# Patient Record
Sex: Female | Born: 1980
Health system: Southern US, Community
[De-identification: ages and names within clinical notes are randomized; demographics above are authoritative.]

## PROBLEM LIST (undated history)

## (undated) DIAGNOSIS — G971 Other reaction to spinal and lumbar puncture: Secondary | ICD-10-CM

## (undated) DIAGNOSIS — I10 Essential (primary) hypertension: Secondary | ICD-10-CM

## (undated) DIAGNOSIS — E785 Hyperlipidemia, unspecified: Secondary | ICD-10-CM

## (undated) HISTORY — PX: NO PAST SURGERIES: SHX2092

## (undated) HISTORY — DX: Hyperlipidemia, unspecified: E78.5

## (undated) HISTORY — DX: Other reaction to spinal and lumbar puncture: G97.1

## (undated) HISTORY — DX: Essential (primary) hypertension: I10

---

## 2003-05-09 ENCOUNTER — Ambulatory Visit (HOSPITAL_COMMUNITY): Admission: RE | Admit: 2003-05-09 | Discharge: 2003-05-09 | Payer: Self-pay | Admitting: Obstetrics & Gynecology

## 2003-07-19 ENCOUNTER — Encounter: Admission: RE | Admit: 2003-07-19 | Discharge: 2003-07-19 | Payer: Self-pay | Admitting: Internal Medicine

## 2003-11-04 ENCOUNTER — Encounter: Admission: RE | Admit: 2003-11-04 | Discharge: 2003-11-04 | Payer: Self-pay | Admitting: Chiropractic Medicine

## 2006-01-14 ENCOUNTER — Ambulatory Visit (HOSPITAL_COMMUNITY): Admission: RE | Admit: 2006-01-14 | Discharge: 2006-01-14 | Payer: Self-pay | Admitting: Obstetrics & Gynecology

## 2006-06-15 ENCOUNTER — Inpatient Hospital Stay (HOSPITAL_COMMUNITY): Admission: AD | Admit: 2006-06-15 | Discharge: 2006-07-07 | Payer: Self-pay | Admitting: Obstetrics

## 2006-06-17 ENCOUNTER — Encounter: Payer: Self-pay | Admitting: Obstetrics & Gynecology

## 2006-07-03 ENCOUNTER — Encounter: Payer: Self-pay | Admitting: Obstetrics

## 2006-07-05 ENCOUNTER — Encounter: Payer: Self-pay | Admitting: Obstetrics

## 2006-10-19 ENCOUNTER — Ambulatory Visit (HOSPITAL_COMMUNITY): Admission: RE | Admit: 2006-10-19 | Discharge: 2006-10-19 | Payer: Self-pay | Admitting: Obstetrics

## 2008-06-03 IMAGING — US US OB FOLLOW-UP
1 series · 14 of 28 positions shown · non-contrast
Comparison: none

OBSTETRICAL ULTRASOUND:
 This ultrasound was performed in The [HOSPITAL], and the [facility] report will be stored to [REDACTED] PACS.

[Series 1: us ob follow-up · 14 of 36 slices shown]
[im 2/36]
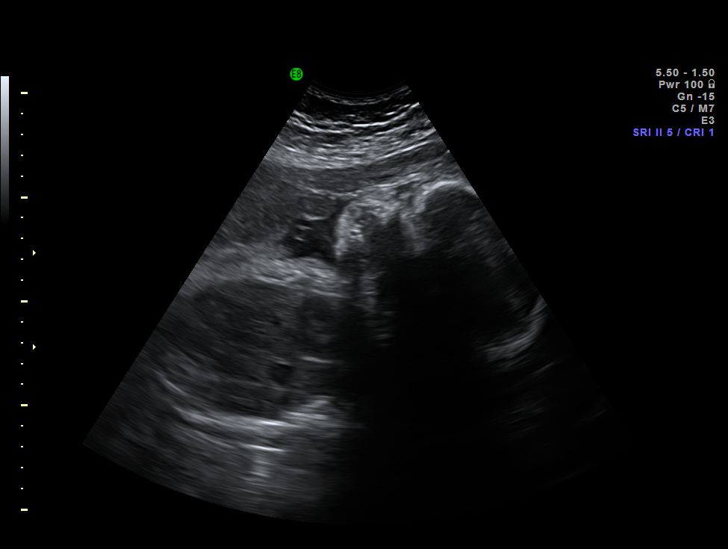
[im 4/36]
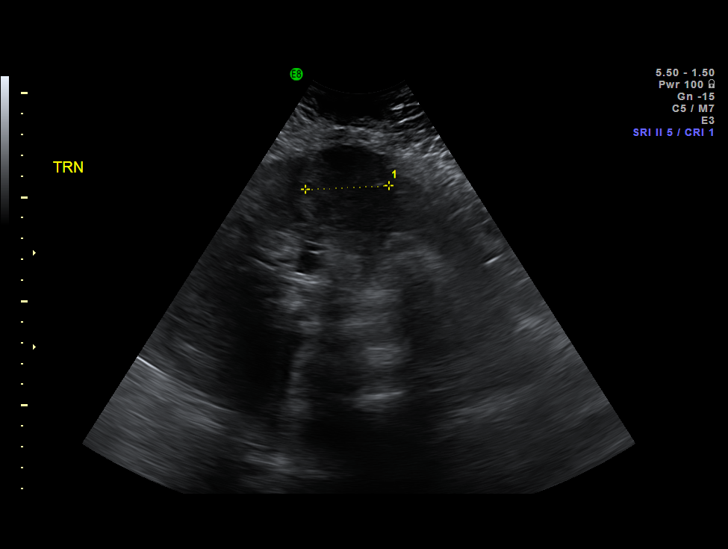
[im 7/36]
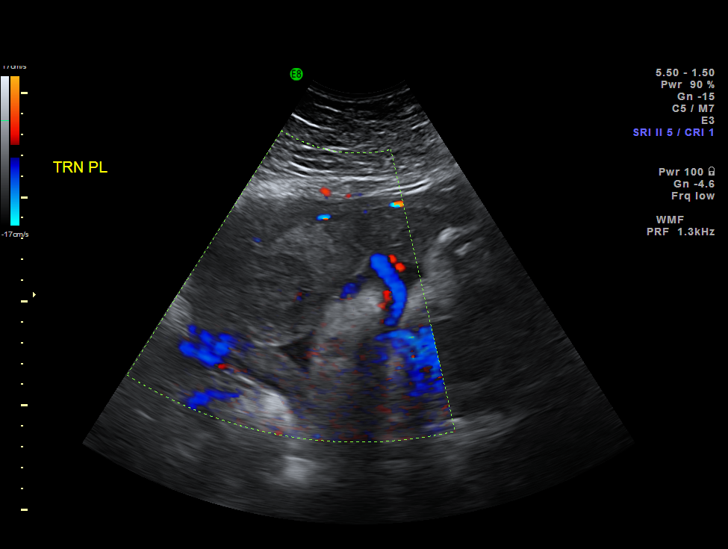
[im 10/36]
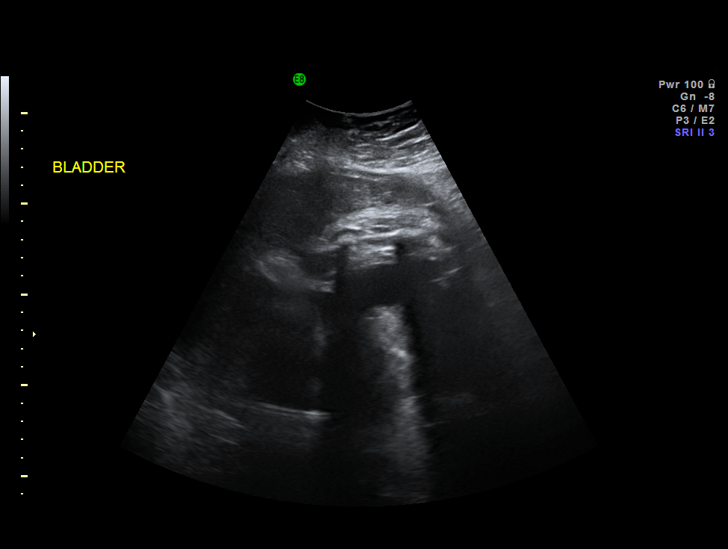
[im 12/36]
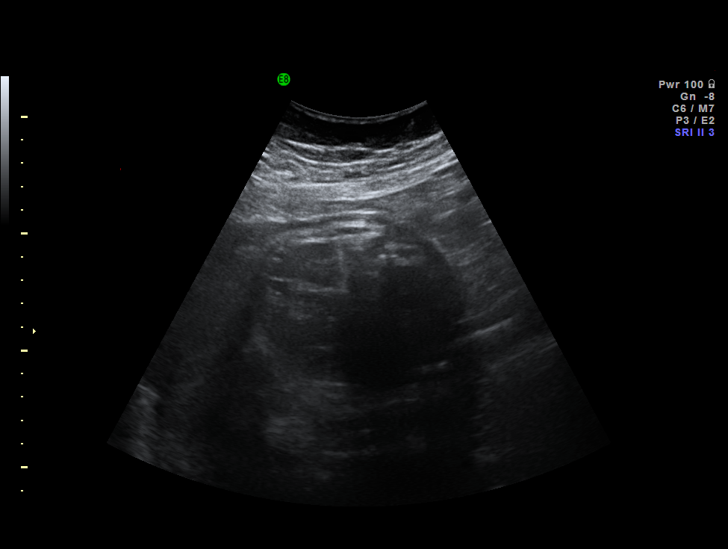
[im 15/36]
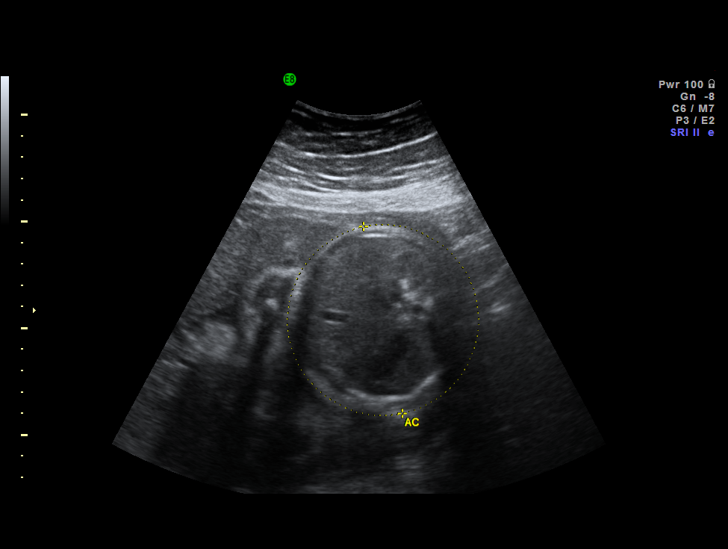
[im 17/36]
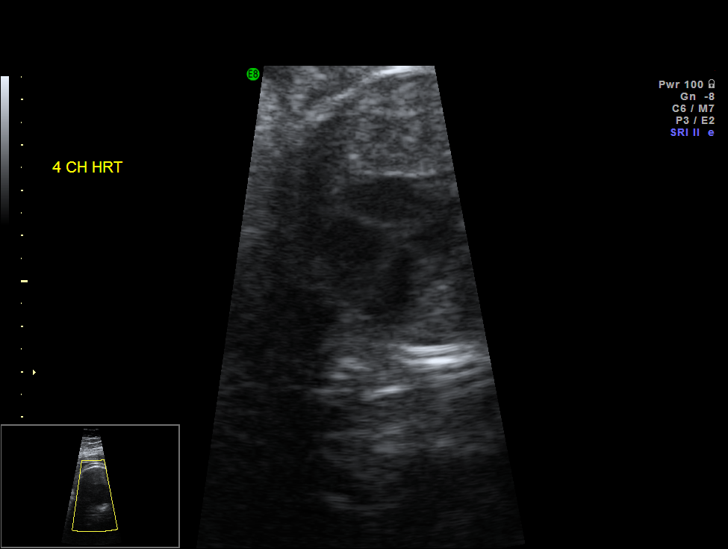
[im 20/36]
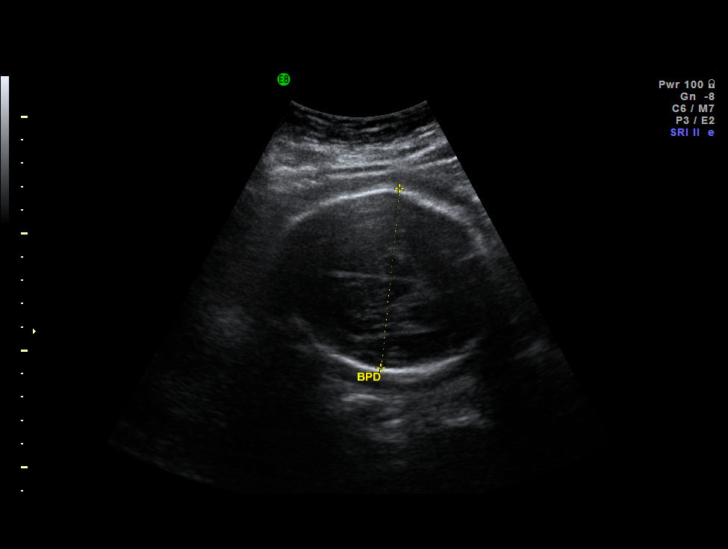
[im 23/36]
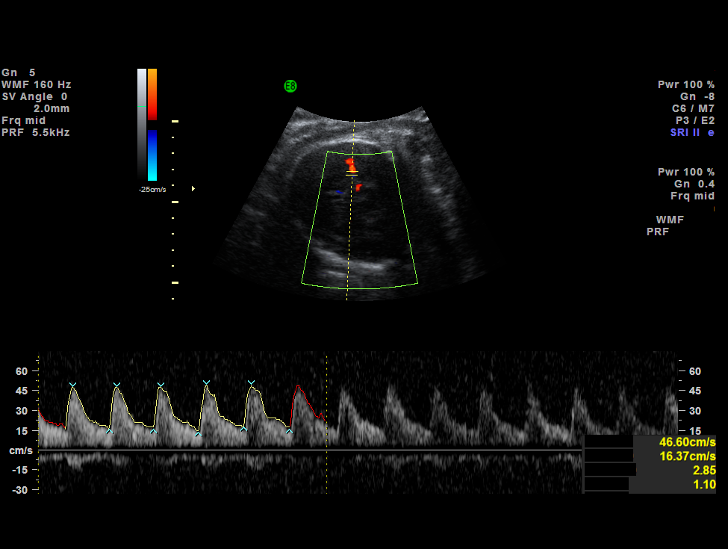
[im 25/36]
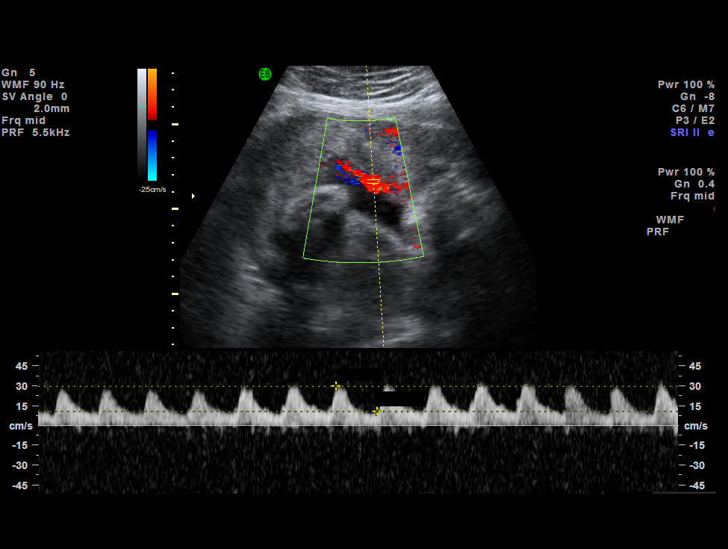
[im 28/36]
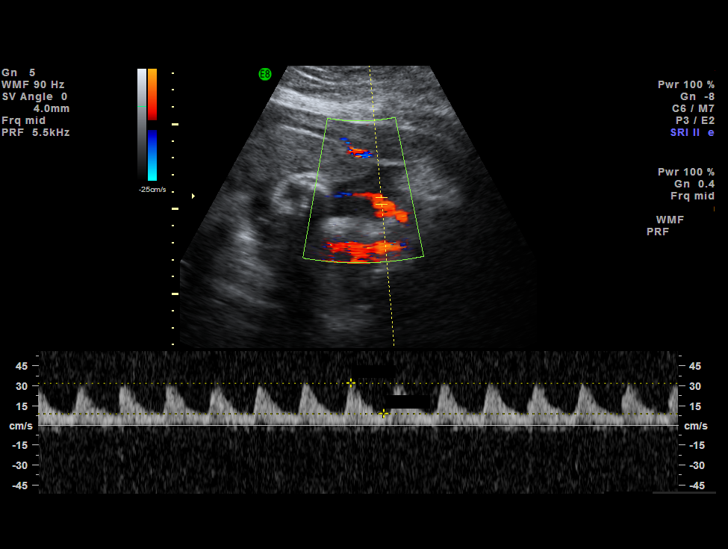
[im 30/36]
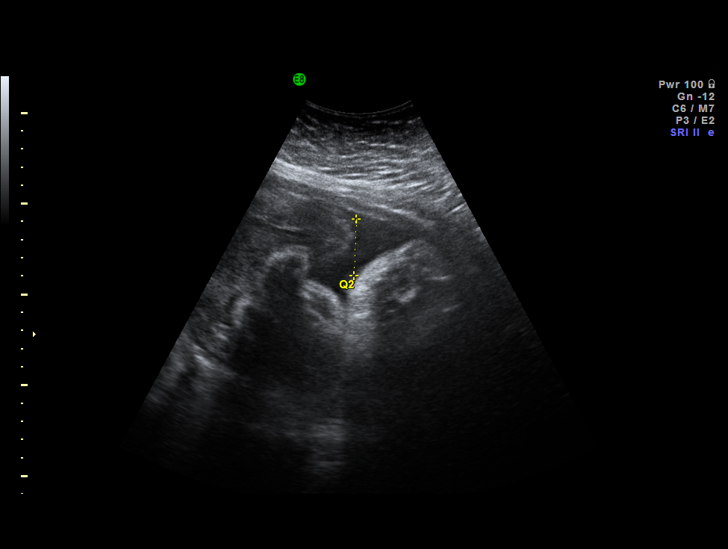
[im 33/36]
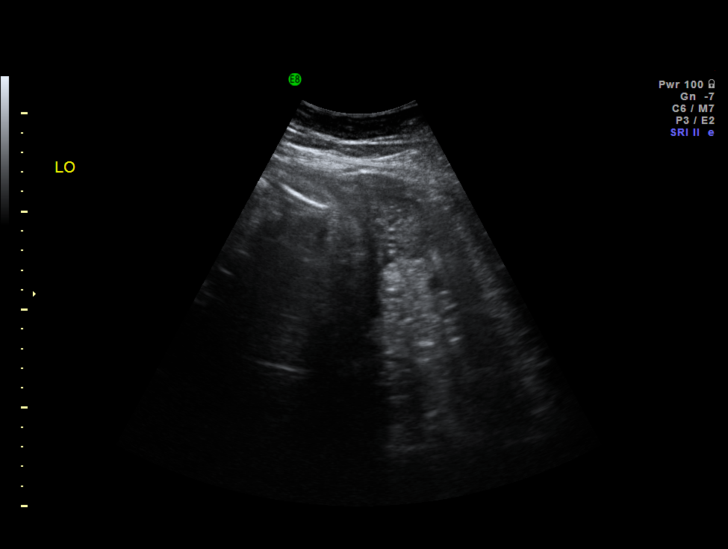
[im 36/36]
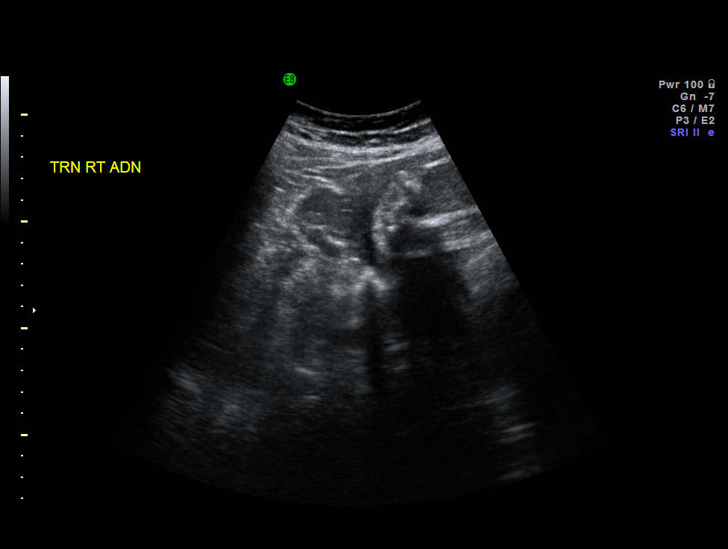

[14 of 28 positions shown; findings below may reference images not displayed]

IMPRESSION: The [facility] report has also been faxed to the ordering physician.

## 2008-10-13 ENCOUNTER — Ambulatory Visit (HOSPITAL_COMMUNITY): Admission: RE | Admit: 2008-10-13 | Discharge: 2008-10-13 | Payer: Self-pay | Admitting: Obstetrics & Gynecology

## 2008-11-27 ENCOUNTER — Ambulatory Visit (HOSPITAL_COMMUNITY): Admission: RE | Admit: 2008-11-27 | Discharge: 2008-11-27 | Payer: Self-pay | Admitting: Obstetrics & Gynecology

## 2009-01-08 ENCOUNTER — Ambulatory Visit (HOSPITAL_COMMUNITY): Admission: RE | Admit: 2009-01-08 | Discharge: 2009-01-08 | Payer: Self-pay | Admitting: Obstetrics & Gynecology

## 2009-01-29 ENCOUNTER — Ambulatory Visit (HOSPITAL_COMMUNITY): Admission: RE | Admit: 2009-01-29 | Discharge: 2009-01-29 | Payer: Self-pay | Admitting: Obstetrics & Gynecology

## 2009-02-26 ENCOUNTER — Ambulatory Visit (HOSPITAL_COMMUNITY): Admission: RE | Admit: 2009-02-26 | Discharge: 2009-02-26 | Payer: Self-pay | Admitting: Obstetrics & Gynecology

## 2009-03-26 ENCOUNTER — Ambulatory Visit (HOSPITAL_COMMUNITY): Admission: RE | Admit: 2009-03-26 | Discharge: 2009-03-26 | Payer: Self-pay | Admitting: Obstetrics & Gynecology

## 2009-04-02 ENCOUNTER — Ambulatory Visit (HOSPITAL_COMMUNITY): Admission: RE | Admit: 2009-04-02 | Discharge: 2009-04-02 | Payer: Self-pay | Admitting: Obstetrics & Gynecology

## 2009-04-20 ENCOUNTER — Inpatient Hospital Stay (HOSPITAL_COMMUNITY): Admission: RE | Admit: 2009-04-20 | Discharge: 2009-04-23 | Payer: Self-pay | Admitting: Obstetrics & Gynecology

## 2009-04-20 ENCOUNTER — Encounter: Payer: Self-pay | Admitting: Obstetrics & Gynecology

## 2009-06-21 ENCOUNTER — Encounter: Payer: Self-pay | Admitting: Internal Medicine

## 2010-02-17 ENCOUNTER — Encounter: Payer: Self-pay | Admitting: Obstetrics

## 2010-02-17 ENCOUNTER — Encounter: Payer: Self-pay | Admitting: Obstetrics & Gynecology

## 2010-02-26 NOTE — Consult Note (Signed)
Summary: [facility]  [facility]   Imported By: Marylou Mccoy 07/18/2009 15:45:36  _____________________________________________________________________  External Attachment:    Type:   Image     Comment:   External Document

## 2010-04-21 LAB — CBC
HCT: 34.2 % — ABNORMAL LOW (ref 36.0–46.0)
HCT: 36.8 % (ref 36.0–46.0)
Hemoglobin: 11.2 g/dL — ABNORMAL LOW (ref 12.0–15.0)
Hemoglobin: 12.2 g/dL (ref 12.0–15.0)
MCHC: 33 g/dL (ref 30.0–36.0)
MCV: 81.9 fL (ref 78.0–100.0)
MCV: 82.9 fL (ref 78.0–100.0)
Platelets: 193 10*3/uL (ref 150–400)
RBC: 4.1 MIL/uL (ref 3.87–5.11)
RBC: 4.43 MIL/uL (ref 3.87–5.11)
RBC: 4.49 MIL/uL (ref 3.87–5.11)
RDW: 15.1 % (ref 11.5–15.5)
RDW: 15.3 % (ref 11.5–15.5)
WBC: 10.4 10*3/uL (ref 4.0–10.5)
WBC: 6.4 10*3/uL (ref 4.0–10.5)
WBC: 9 10*3/uL (ref 4.0–10.5)

## 2010-04-21 LAB — COMPREHENSIVE METABOLIC PANEL
AST: 15 U/L (ref 0–37)
CO2: 20 mEq/L (ref 19–32)
Calcium: 8.9 mg/dL (ref 8.4–10.5)
Creatinine, Ser: 0.45 mg/dL (ref 0.4–1.2)
GFR calc Af Amer: >60 mL/min (ref >60)
GFR calc non Af Amer: >60 mL/min (ref >60)

## 2010-04-21 LAB — RPR: RPR Ser Ql: NONREACTIVE

## 2010-04-21 LAB — MRSA PCR SCREENING: MRSA by PCR: NEGATIVE

## 2010-06-11 NOTE — H&P (Signed)
NAMEJENNE, Caroline Jones             ACCOUNT NO.:  [id number]   MEDICAL RECORD NO.:  [id number]          PATIENT TYPE:  INP   LOCATION:  9153                          FACILITY:  [facility]   PHYSICIAN:  Roseanna Rainbow, M.D.DATE OF BIRTH:  23-Apr-1980   DATE OF ADMISSION:  06/15/2006  DATE OF DISCHARGE:                              HISTORY & PHYSICAL   CHIEF COMPLAINT:  The patient is a 30 year old, gravida 1, para 0, with  and estimated date of confinement of July 21, now at 31 weeks with  elevated blood pressure.   HISTORY OF PRESENT ILLNESS:  The patient has had somewhat labile blood  pressures dating back to the early second trimester of pregnancy.  At 14  weeks, the blood pressure was 140/94.  She denies any neurological  symptoms.  She does report non-dependent swelling in her hands and face.   ALLERGIES:  No known drug allergies.   MEDICATIONS:  Prenatal vitamins.   PRENATAL SCREENINGS:  Chlamydia probe negative.  Urine culture and  sensitivity: Insignificant growth.  GC probe negative.  One-hour GTT  113.  Hepatitis B surface antigen negative.  Hematocrit 38.7, hemoglobin  11.5.  HIV nonreactive.  Pap smear negative.  Platelets 426,000.  Blood  type O positive.  Antibody screen negative.  RPR nonreactive.  Rubella  immune.  Sickle cell negative.  Varicella immune.  PIH panel on May 15  remarkable for uric acid of 6.4.  A 24-hour urine collection is pending.   PAST GYN HISTORY:  Noncontributory.   PAST MEDICAL HISTORY:  No significant history of medical diseases.   PAST SURGICAL HISTORY:  No previous surgery.   SOCIAL HISTORY:  She is married, living with her spouse.  Denies any  tobacco, ethanol, or drug use.   PHYSICAL EXAMINATION:  VITAL SIGNS:  Blood pressure 156/100.  Nonstress  test reassuring.  Urine dip: Trace protein.  GENERAL:  Well-developed, well-nourished, no apparent distress.  ABDOMEN: Gravid.  PELVIC:  Exam deferred.  EXTREMITIES:  Lower extremities  1-2+ pretibial edema.   ASSESSMENT:  Primigravida with intrauterine pregnancy at 31 weeks with  questionable chronic blood pressure issues, rule out superimposed  pregnancy-induced hypertension.   PLAN:  1. Admission.  2. Bed rest.  3. Serial lab work.  4. Review the 24-hour urine collection results.  5. Complete OB ultrasound.  6. Betamethasone to stimulate fetal lung maturity.      Roseanna Rainbow, M.D.  Electronically Signed     LAJ/MEDQ  D:  06/15/2006  T:  06/15/2006  Job:  [id number]

## 2010-06-14 NOTE — Discharge Summary (Signed)
Caroline Jones, Caroline Jones             ACCOUNT NO.:  [id number]   MEDICAL RECORD NO.:  [id number]          PATIENT TYPE:  INP   LOCATION:  9320                          FACILITY:  [facility]   PHYSICIAN:  Charles A. Clearance Coots, M.D.DATE OF BIRTH:  11-11-1980   DATE OF ADMISSION:  06/15/2006  DATE OF DISCHARGE:  07/07/2006                               DISCHARGE SUMMARY   ADMISSION DIAGNOSES:  1. [redacted] weeks gestation.  2. Increased blood pressure.   DISCHARGE DIAGNOSIS:  1. [redacted] weeks gestation.  2. Increased blood pressure.  3. Status post induction of labor and normal spontaneous vaginal      delivery, viable female on July 05, 2006 at 0939.  Apgars 8 at 1      minute 8 at 5 minutes, weight of 1486 grams, length of 44 cm.      Infant was taken to the neonatal intensive care unit, mother      discharged home in good condition.   REASON FOR ADMISSION:  30 year old G1, estimated date of confinement  August 17, 2006, presents at [redacted] weeks gestation with elevated blood  pressure.  The patient has had a somewhat labile blood pressure dating  back to the early second trimester pregnancy. At 14 weeks the blood  pressure was 140/94.  She denied any neurological symptoms.  She does  report non dependent swelling in her hands and face.   PAST MEDICAL HISTORY:  Surgery none.  Illnesses none.   MEDICATIONS:  Prenatal vitamins.   ALLERGIES:  NO KNOWN DRUG ALLERGIES.   SOCIAL HISTORY:  Married, living with spouse.  Denies any tobacco,  alcohol or recreational drug use.   PHYSICAL EXAM:  VITAL SIGNS:  Blood pressure 156/100, she is afebrile.  External fetal monitoring was reassuring.  LUNGS:  Clear to auscultation bilaterally.  HEART: Regular rate and rhythm.  ABDOMEN:  Gravid, nontender.  PELVIC:  Cervical exam was deferred.  EXTREMITIES:  Revealed 1-2+ pretibial edema of the lower extremities.   IMPRESSION:  [redacted] weeks gestation, increased blood pressure rule out  pregnancy-induced hypertension.   PLAN:   Admit, placed on bedrest.  Serial labs, start a 24-hour urine  collection for creatinine clearance and total protein.   ADMITTING LABORATORY:  Hemoglobin 11.8, hematocrit 35.9, white blood  cell count 10,000, platelets 232,000.  Comprehensive metabolic panel was  within normal limits.  Urinalysis revealed specific gravity 1.030,  negative protein.   HOSPITAL COURSE:  The patient was admitted and placed on bedrest, blood  pressures continued to be elevated. The patient was placed on magnesium  sulfate IV along with p.o. labetalol. She continued to have increased  blood pressures despite the magnesium sulfate and p.o. labetalol. At  approximately [redacted] weeks gestation a decision was made to proceed with  induction of labor for preeclampsia. The patient was transferred to  labor and delivery and underwent two-stage induction of labor with  cervical ripening followed by Pitocin. She progressed quite well in  labor to normal spontaneous vaginal delivery viable female infant on  July 05, 2006. Postpartum course was complicated by increased blood  pressures and the patient was  continued on IV magnesium sulfate and p.o.  antihypertensives for elevated blood pressures. Pressures were stable at  the systolic to 140-160 over diastolics of 80-100 on postpartum day #2.  The patient was discharged home on postpartum day #2 on p.o.  antihypertensive therapy to be followed up with blood pressure checks at  home and in the office.   DISCHARGE LABORATORY VALUES:  Hemoglobin 11.7, hematocrit 35.4, white  blood cell count 8600, platelets 152,000. Comprehensive metabolic panel  was within normal limits.   DISCHARGE DISPOSITION:   DISCHARGE MEDICATIONS:  Continue labetalol and Clonidine as directed;  Clonidine 0.2 mg p.o. b.i.d. and labetalol 200 mg p.o. twice a day.   The patient is to have a home health nurse come for a blood pressure  check daily and she will follow up in the office in 1 week.       Charles A. Clearance Coots, M.D.  Electronically Signed     CAH/MEDQ  D:  08/06/2006  T:  08/06/2006  Job:  [id number]

## 2010-10-21 ENCOUNTER — Other Ambulatory Visit: Payer: Self-pay | Admitting: Internal Medicine

## 2010-10-21 ENCOUNTER — Ambulatory Visit (INDEPENDENT_AMBULATORY_CARE_PROVIDER_SITE_OTHER): Payer: 59 | Admitting: Internal Medicine

## 2010-10-21 ENCOUNTER — Other Ambulatory Visit (INDEPENDENT_AMBULATORY_CARE_PROVIDER_SITE_OTHER): Payer: 59

## 2010-10-21 ENCOUNTER — Encounter: Payer: Self-pay | Admitting: Internal Medicine

## 2010-10-21 VITALS — BP 142/88 | HR 79 | Temp 98.8°F | Resp 16 | Wt 224.4 lb

## 2010-10-21 DIAGNOSIS — I10 Essential (primary) hypertension: Secondary | ICD-10-CM

## 2010-10-21 DIAGNOSIS — Z136 Encounter for screening for cardiovascular disorders: Secondary | ICD-10-CM

## 2010-10-21 DIAGNOSIS — R739 Hyperglycemia, unspecified: Secondary | ICD-10-CM | POA: Insufficient documentation

## 2010-10-21 DIAGNOSIS — R7309 Other abnormal glucose: Secondary | ICD-10-CM

## 2010-10-21 DIAGNOSIS — Z23 Encounter for immunization: Secondary | ICD-10-CM | POA: Insufficient documentation

## 2010-10-21 DIAGNOSIS — E7800 Pure hypercholesterolemia, unspecified: Secondary | ICD-10-CM

## 2010-10-21 LAB — COMPREHENSIVE METABOLIC PANEL
ALT: 15 U/L (ref 0–35)
AST: 14 U/L (ref 0–37)
Albumin: 3.7 g/dL (ref 3.5–5.2)
Alkaline Phosphatase: 86 U/L (ref 39–117)
Calcium: 8.6 mg/dL (ref 8.4–10.5)
Chloride: 103 mEq/L (ref 96–112)
Potassium: 3.9 mEq/L (ref 3.5–5.1)

## 2010-10-21 LAB — CBC WITH DIFFERENTIAL/PLATELET
Basophils Absolute: 0 10*3/uL (ref 0.0–0.1)
Eosinophils Absolute: 0.1 10*3/uL (ref 0.0–0.7)
Lymphocytes Relative: 23.4 % (ref 12.0–46.0)
Monocytes Relative: 4.5 % (ref 3.0–12.0)
Neutrophils Relative %: 70.6 % (ref 43.0–77.0)
Platelets: 331 10*3/uL (ref 150.0–400.0)
RDW: 16.3 % — ABNORMAL HIGH (ref 11.5–14.6)

## 2010-10-21 LAB — URINALYSIS, ROUTINE W REFLEX MICROSCOPIC
Hgb urine dipstick: NEGATIVE
Nitrite: NEGATIVE
Total Protein, Urine: NEGATIVE
Urine Glucose: NEGATIVE
pH: 5.5 (ref 5.0–8.0)

## 2010-10-21 LAB — LIPID PANEL
HDL: 41.4 mg/dL (ref >39.00)
LDL Cholesterol: 140 mg/dL — ABNORMAL HIGH (ref 0–99)
Total CHOL/HDL Ratio: 5
Triglycerides: 82 mg/dL (ref 0.0–149.0)
VLDL: 16.4 mg/dL (ref 0.0–40.0)

## 2010-10-21 LAB — HCG, QUANTITATIVE, PREGNANCY: hCG, Beta Chain, Quant, S: <0.5 m[IU]/mL

## 2010-10-21 MED ORDER — NEBIVOLOL HCL 5 MG PO TABS: 5.0000 mg | ORAL_TABLET | Freq: Every day | ORAL | Status: DC

## 2010-10-21 NOTE — Assessment & Plan Note (Signed)
Check FLP and TSH today

## 2010-10-21 NOTE — Assessment & Plan Note (Signed)
EKG is normal today

## 2010-10-21 NOTE — Patient Instructions (Signed)

## 2010-10-21 NOTE — Assessment & Plan Note (Signed)
Start bystolic and check labs today to look for secondary causes and end organ damage

## 2010-10-21 NOTE — Assessment & Plan Note (Signed)
Check A1C today

## 2010-10-21 NOTE — Progress Notes (Signed)
  Subjective:    Patient ID: Caroline Jones, female    DOB: 04/03/1980, 30 y.o.   MRN: [id number]  Hypertension This is a chronic problem. The current episode started more than 1 year ago. The problem has been gradually worsening since onset. The problem is uncontrolled. Pertinent negatives include no anxiety, blurred vision, chest pain, headaches, malaise/fatigue, neck pain, orthopnea, palpitations, peripheral edema, PND, shortness of breath or sweats. Past treatments include nothing. Compliance problems include exercise and diet.       Review of Systems  Constitutional: Negative for fever, chills, malaise/fatigue, diaphoresis, activity change, appetite change, fatigue and unexpected weight change.  HENT: Negative for facial swelling and neck pain.   Eyes: Negative.  Negative for blurred vision.  Respiratory: Negative for apnea, cough, choking, chest tightness, shortness of breath, wheezing and stridor.   Cardiovascular: Negative for chest pain, palpitations, orthopnea, leg swelling and PND.  Gastrointestinal: Negative for nausea, vomiting, abdominal pain, diarrhea, constipation, blood in stool, abdominal distention, anal bleeding and rectal pain.  Genitourinary: Negative for dysuria, urgency, frequency, hematuria, flank pain, decreased urine volume, enuresis, difficulty urinating, genital sores and dyspareunia.  Musculoskeletal: Negative for myalgias, back pain, joint swelling, arthralgias and gait problem.  Skin: Negative for color change, pallor, rash and wound.  Neurological: Negative for dizziness, tremors, seizures, syncope, facial asymmetry, speech difficulty, weakness, light-headedness, numbness and headaches.  Hematological: Negative for adenopathy. Does not bruise/bleed easily.  Psychiatric/Behavioral: Negative.        Objective:   Physical Exam  Vitals reviewed. Constitutional: She is oriented to person, place, and time. She appears well-developed and well-nourished. No  distress.  HENT:  Mouth/Throat: Oropharynx is clear and moist. No oropharyngeal exudate.  Eyes: Conjunctivae are normal. Right eye exhibits no discharge. Left eye exhibits no discharge. No scleral icterus.  Neck: Normal range of motion. Neck supple. No JVD present. No tracheal deviation present. No thyromegaly present.  Cardiovascular: Normal rate, regular rhythm, normal heart sounds and intact distal pulses.  Exam reveals no gallop and no friction rub.   No murmur heard. Pulmonary/Chest: Effort normal and breath sounds normal. No stridor. No respiratory distress. She has no wheezes. She has no rales. She exhibits no tenderness.  Abdominal: Soft. Bowel sounds are normal. She exhibits no distension and no mass. There is no tenderness. There is no rebound and no guarding.  Musculoskeletal: Normal range of motion. She exhibits no edema and no tenderness.  Lymphadenopathy:    She has no cervical adenopathy.  Neurological: She is oriented to person, place, and time. She displays normal reflexes. No cranial nerve deficit. She exhibits normal muscle tone. Coordination normal.  Skin: Skin is warm and dry. No rash noted. She is not diaphoretic. No erythema. No pallor.  Psychiatric: She has a normal mood and affect. Her behavior is normal. Judgment and thought content normal.      Lab Results  Component Value Date   WBC 10.4 04/22/2009   HGB 11.2* 04/22/2009   HCT 34.2* 04/22/2009   PLT 218 04/22/2009   ALT 14 04/20/2009   AST 15 04/20/2009   NA 136 04/20/2009   K 3.7 04/20/2009   CL 107 04/20/2009   CREATININE 0.45 04/20/2009   BUN 10 04/20/2009   CO2 20 04/20/2009      Assessment & Plan:

## 2010-10-22 ENCOUNTER — Encounter: Payer: Self-pay | Admitting: Internal Medicine

## 2010-10-22 LAB — HEMOGLOBIN A1C: Hgb A1c MFr Bld: 5.6 % (ref 4.6–6.5)

## 2010-11-11 ENCOUNTER — Telehealth: Payer: Self-pay | Admitting: *Deleted

## 2010-11-11 NOTE — Telephone Encounter (Signed)
Husband left vm req that we call patient back on her cell and leave detailed VM if she did not answer. Cell # [phone]  Husband is concerned that patient is having a reaction to the tetanus and/or flu vaccine that she received at [facility] on 9/24. She had some flu-like symptoms and now has a rash. I attempted to call patient, no answer. I left her detailed VM to call office back and ask to speak w/someone, we need to get more details of her symptoms to determine if OV/treatment is needed.

## 2010-11-11 NOTE — Telephone Encounter (Signed)
Spoke w/pt - she describes pustular rash on back of shoulder. I advised she come in for OV asap and transferred her to a scheduler.

## 2010-11-12 ENCOUNTER — Ambulatory Visit (INDEPENDENT_AMBULATORY_CARE_PROVIDER_SITE_OTHER): Payer: 59 | Admitting: Internal Medicine

## 2010-11-12 VITALS — BP 136/82 | HR 61 | Temp 98.1°F | Wt 226.0 lb

## 2010-11-12 DIAGNOSIS — J069 Acute upper respiratory infection, unspecified: Secondary | ICD-10-CM

## 2010-11-12 DIAGNOSIS — L42 Pityriasis rosea: Secondary | ICD-10-CM | POA: Insufficient documentation

## 2010-11-12 MED ORDER — FLUOCINONIDE 0.05 % EX CREA: TOPICAL_CREAM | CUTANEOUS | Status: DC

## 2010-11-12 MED ORDER — LEVOCETIRIZINE DIHYDROCHLORIDE 5 MG PO TABS: 5.0000 mg | ORAL_TABLET | Freq: Every evening | ORAL | Status: DC

## 2010-11-12 NOTE — Patient Instructions (Signed)
Pityriasis Rosea     Pityriasis rosea is a rash which is probably caused by a virus. It generally starts as a scaly, red patch on the trunk (area a t-shirt would cover) but does not appear on sun exposed areas. The rash is usually preceded by an initial larger spot called the "Herald Patch" a week or more before the rest of the rash appears. Generally within one to two days the rash appears rapidly on the trunk, upper arms, and sometimes the upper legs. The rash usually appears as flat, oval patches of scaly pink to salmon-color. The rash can also be raised and one is able to feel it with their finger. The rash can also be finely crinkled and may slough off leaving a ring of scale around the spot. Sometimes a mild sore throat is present with the rash. It usually affects children and young adults in the spring and autumn. Women are more frequently affected than men.     TREATMENT  Pityriasis rosea is a self-limited condition. This means it goes away within 4 to 8 weeks without treatment. The spots may persist for several months especially in darker-colored skin after the rash has resolved and healed.  Benadryl and steroid creams may be used if itching is a problem.     SEEK MEDICAL CARE IF:   Your rash does not go away or persists longer than three months.   You develop fever and joint pain.   You develop severe headache and confusion.   You develop breathing difficulty, vomiting and/or extreme weakness.     Document Released: 02/19/2001  Document Re-Released: 04/11/2008  [employer] Patient Information 2011 [employer].

## 2010-11-14 LAB — URINE CULTURE

## 2010-11-14 LAB — COMPREHENSIVE METABOLIC PANEL
ALT: 10
ALT: 15
AST: 15
Albumin: 2.1 — ABNORMAL LOW
Albumin: 2.3 — ABNORMAL LOW
Alkaline Phosphatase: 109
Alkaline Phosphatase: 122 — ABNORMAL HIGH
BUN: 11
Calcium: 8.8
Chloride: 108
GFR calc Af Amer: >60
Glucose, Bld: 75
Glucose, Bld: 75
Potassium: 4
Potassium: 4
Sodium: 136
Sodium: 138
Total Bilirubin: 0.4
Total Protein: 5.2 — ABNORMAL LOW
Total Protein: 6.4

## 2010-11-14 LAB — CBC
HCT: 39.5
HCT: 40.5
Hemoglobin: 11.7 — ABNORMAL LOW
Hemoglobin: 11.9 — ABNORMAL LOW
Hemoglobin: 13
Hemoglobin: 13.3
MCHC: 33
MCHC: 33.5
MCV: 82.8
Platelets: 215
RBC: 4.27
RBC: 4.94
RDW: 14.7 — ABNORMAL HIGH
RDW: 14.9 — ABNORMAL HIGH
RDW: 15.4 — ABNORMAL HIGH
WBC: 8

## 2010-11-14 LAB — CREATININE CLEARANCE, URINE, 24 HOUR
Collection Interval-CRCL: 24
Creatinine Clearance: 151 — ABNORMAL HIGH
Creatinine, 24H Ur: 1128
Creatinine, Urine: 141
Creatinine: 0.52

## 2010-11-14 LAB — STREP B DNA PROBE: Strep Group B Ag: NEGATIVE

## 2010-11-14 LAB — URINALYSIS, MICROSCOPIC ONLY
Bilirubin Urine: NEGATIVE
Glucose, UA: NEGATIVE
Ketones, ur: NEGATIVE
Nitrite: NEGATIVE
Specific Gravity, Urine: >1.03 — ABNORMAL HIGH
pH: 6

## 2010-11-14 LAB — CREATININE, SERUM
Creatinine, Ser: 0.52
GFR calc Af Amer: >60

## 2010-11-14 LAB — LACTATE DEHYDROGENASE
LDH: 126
LDH: 144

## 2010-11-14 LAB — PROTEIN, URINE, 24 HOUR
Protein, 24H Urine: 1600 — ABNORMAL HIGH
Urine Total Volume-UPROT: 800

## 2010-11-14 LAB — URIC ACID: Uric Acid, Serum: 7.6 — ABNORMAL HIGH

## 2010-11-15 ENCOUNTER — Encounter: Payer: Self-pay | Admitting: Internal Medicine

## 2010-11-15 DIAGNOSIS — J069 Acute upper respiratory infection, unspecified: Secondary | ICD-10-CM | POA: Insufficient documentation

## 2010-11-15 NOTE — Assessment & Plan Note (Signed)
She has significant s/s so I gave her an injection of depo-medrol IM and topical steroids as well, she was given pt ed material as well

## 2010-11-15 NOTE — Assessment & Plan Note (Signed)
This has resolved.

## 2010-11-15 NOTE — Progress Notes (Signed)
  Subjective:    Patient ID: Caroline Jones, female    DOB: 1980-06-17, 30 y.o.   MRN: [id number]  HPI She returns c/o an itchy rash that spreads over her left back, torso, axilla and flank. The rash was preceded by a sore throat, chills, low grade fever, fatigue, and myalgias.    Review of Systems  Constitutional: Positive for fever and chills. Negative for diaphoresis, activity change, appetite change, fatigue and unexpected weight change.  HENT: Positive for sore throat. Negative for nosebleeds, congestion, facial swelling, rhinorrhea, sneezing, trouble swallowing, neck pain, neck stiffness, voice change, postnasal drip and sinus pressure.   Eyes: Negative.   Respiratory: Negative for apnea, cough, choking, chest tightness, shortness of breath, wheezing and stridor.   Cardiovascular: Negative for chest pain, palpitations and leg swelling.  Gastrointestinal: Negative for nausea, vomiting, abdominal pain, diarrhea and constipation.  Genitourinary: Negative for dysuria, urgency, frequency, hematuria, flank pain, decreased urine volume, enuresis, difficulty urinating and dyspareunia.  Skin: Positive for rash. Negative for color change, pallor and wound.  Neurological: Negative for dizziness, tremors, seizures, syncope, facial asymmetry, speech difficulty, weakness, light-headedness, numbness and headaches.  Hematological: Negative for adenopathy. Does not bruise/bleed easily.       Objective:   Physical Exam  Vitals reviewed. Constitutional: She is oriented to person, place, and time. She appears well-developed and well-nourished. No distress.  HENT:  Head: Normocephalic and atraumatic.  Mouth/Throat: Uvula is midline, oropharynx is clear and moist and mucous membranes are normal. Mucous membranes are not pale, not dry and not cyanotic. No uvula swelling. No oropharyngeal exudate, posterior oropharyngeal edema, posterior oropharyngeal erythema or tonsillar abscesses.  Eyes: Conjunctivae  are normal. Right eye exhibits no discharge. Left eye exhibits no discharge. No scleral icterus.  Neck: Normal range of motion. Neck supple. No JVD present. No tracheal deviation present. No thyromegaly present.  Cardiovascular: Normal rate, regular rhythm, normal heart sounds and intact distal pulses.  Exam reveals no gallop and no friction rub.   No murmur heard. Pulmonary/Chest: Effort normal and breath sounds normal. No stridor. No respiratory distress. She has no wheezes. She has no rales. She exhibits no tenderness.  Abdominal: Soft. Bowel sounds are normal. She exhibits no distension and no mass. There is no tenderness. There is no rebound and no guarding.  Musculoskeletal: Normal range of motion. She exhibits no edema and no tenderness.  Lymphadenopathy:    She has no cervical adenopathy.  Neurological: She is oriented to person, place, and time. She displays normal reflexes. She exhibits normal muscle tone.  Skin: Skin is warm, dry and intact. Rash noted. No abrasion, no petechiae and no purpura noted. Rash is not nodular, not pustular, not vesicular and not urticarial. She is not diaphoretic. No cyanosis or erythema. No pallor. Nails show no clubbing.          She has the classic "herald patch" over her left scapular area, it is a 2 cm patch with scale and very slight erythema, the edges are well demarcated, there are several more polygonally shaped lesions that extend over the back and into the torso and axilla in a christmas tree type pattern, there are no vesicles or pustules  Psychiatric: She has a normal mood and affect. Her behavior is normal. Judgment and thought content normal.          Assessment & Plan:

## 2010-12-23 ENCOUNTER — Encounter: Payer: Self-pay | Admitting: Internal Medicine

## 2010-12-23 ENCOUNTER — Ambulatory Visit (INDEPENDENT_AMBULATORY_CARE_PROVIDER_SITE_OTHER): Payer: 59 | Admitting: Internal Medicine

## 2010-12-23 VITALS — BP 142/98 | HR 59 | Temp 98.8°F | Resp 16 | Wt 227.0 lb

## 2010-12-23 DIAGNOSIS — I10 Essential (primary) hypertension: Secondary | ICD-10-CM

## 2010-12-23 MED ORDER — METOPROLOL-HCTZ ER 25-12.5 MG PO TB24: 1.0000 | ORAL_TABLET | Freq: Every day | ORAL | Status: DC

## 2010-12-23 NOTE — Patient Instructions (Signed)

## 2010-12-23 NOTE — Progress Notes (Signed)
  Subjective:    Patient ID: Caroline Jones, female    DOB: 1980/11/14, 30 y.o.   MRN: [id number]  Hypertension This is a chronic problem. The current episode started more than 1 month ago. The problem is unchanged. The problem is uncontrolled. Associated symptoms include headaches. Pertinent negatives include no anxiety, blurred vision, chest pain, malaise/fatigue, neck pain, orthopnea, palpitations, peripheral edema, PND, shortness of breath or sweats. There are no associated agents to hypertension. Past treatments include beta blockers. The current treatment provides mild improvement. Compliance problems include exercise and diet.       Review of Systems  Constitutional: Negative for fever, chills, malaise/fatigue, diaphoresis, activity change, appetite change, fatigue and unexpected weight change.  HENT: Negative for neck pain.   Eyes: Negative.  Negative for blurred vision.  Respiratory: Negative for cough, shortness of breath, wheezing and stridor.   Cardiovascular: Negative for chest pain, palpitations, orthopnea, leg swelling and PND.  Gastrointestinal: Negative for nausea, vomiting, abdominal pain, diarrhea and constipation.  Genitourinary: Negative for dysuria, urgency, frequency, hematuria, flank pain, enuresis, difficulty urinating and dyspareunia.  Musculoskeletal: Negative.   Skin: Negative for color change, pallor, rash and wound.  Neurological: Positive for headaches. Negative for dizziness, tremors, seizures, syncope, facial asymmetry, speech difficulty, weakness, light-headedness and numbness.  Hematological: Negative for adenopathy. Does not bruise/bleed easily.  Psychiatric/Behavioral: Negative.        Objective:   Physical Exam  Vitals reviewed. Constitutional: She is oriented to person, place, and time. She appears well-developed and well-nourished. No distress.  HENT:  Head: Normocephalic and atraumatic.  Mouth/Throat: Oropharynx is clear and moist. No  oropharyngeal exudate.  Eyes: Conjunctivae are normal. Right eye exhibits no discharge. Left eye exhibits no discharge. No scleral icterus.  Neck: Normal range of motion. Neck supple. No JVD present. No tracheal deviation present. No thyromegaly present.  Cardiovascular: Normal rate, regular rhythm, normal heart sounds and intact distal pulses.  Exam reveals no gallop and no friction rub.   No murmur heard. Pulmonary/Chest: Effort normal and breath sounds normal. No stridor. No respiratory distress. She has no wheezes. She has no rales. She exhibits no tenderness.  Abdominal: Soft. Bowel sounds are normal. She exhibits no distension and no mass. There is no tenderness. There is no rebound and no guarding.  Musculoskeletal: Normal range of motion. She exhibits no edema and no tenderness.  Lymphadenopathy:    She has no cervical adenopathy.  Neurological: She is oriented to person, place, and time.  Skin: Skin is warm and dry. No rash noted. She is not diaphoretic. No erythema. No pallor.  Psychiatric: She has a normal mood and affect. Her behavior is normal. Judgment and thought content normal.      Lab Results  Component Value Date   WBC 6.8 10/21/2010   HGB 13.2 10/21/2010   HCT 40.7 10/21/2010   PLT 331.0 10/21/2010   GLUCOSE 77 10/21/2010   CHOL 198 10/21/2010   TRIG 82.0 10/21/2010   HDL 41.40 10/21/2010   LDLCALC 140* 10/21/2010   ALT 15 10/21/2010   AST 14 10/21/2010   NA 138 10/21/2010   K 3.9 10/21/2010   CL 103 10/21/2010   CREATININE 0.5 10/21/2010   BUN 8 10/21/2010   CO2 24 10/21/2010   TSH 1.59 10/21/2010   HGBA1C 5.6 10/21/2010      Assessment & Plan:

## 2010-12-24 NOTE — Assessment & Plan Note (Signed)
She has not improved much on bystolic so I have changed her to dutoprol

## 2011-01-23 ENCOUNTER — Ambulatory Visit (INDEPENDENT_AMBULATORY_CARE_PROVIDER_SITE_OTHER): Payer: 59 | Admitting: Internal Medicine

## 2011-01-23 ENCOUNTER — Encounter: Payer: Self-pay | Admitting: Internal Medicine

## 2011-01-23 VITALS — BP 130/88 | HR 76 | Temp 99.7°F | Resp 16 | Wt 226.0 lb

## 2011-01-23 DIAGNOSIS — I10 Essential (primary) hypertension: Secondary | ICD-10-CM

## 2011-01-23 MED ORDER — METOPROLOL-HCTZ ER 25-12.5 MG PO TB24: 1.0000 | ORAL_TABLET | Freq: Every day | ORAL | Status: DC

## 2011-01-23 NOTE — Progress Notes (Signed)
  Subjective:    Patient ID: Caroline Jones, female    DOB: [birth year], 30 y.o.   MRN: [id number]  Hypertension This is a chronic problem. The current episode started more than 1 year ago. The problem has been gradually improving since onset. The problem is controlled. Pertinent negatives include no anxiety, blurred vision, chest pain, headaches, malaise/fatigue, neck pain, orthopnea, palpitations, peripheral edema, PND, shortness of breath or sweats. Past treatments include beta blockers and diuretics. The current treatment provides moderate improvement. Compliance problems include exercise and diet.       Review of Systems  Constitutional: Negative.  Negative for malaise/fatigue.  HENT: Negative.  Negative for neck pain.   Eyes: Negative.  Negative for blurred vision.  Respiratory: Negative.  Negative for shortness of breath.   Cardiovascular: Negative.  Negative for chest pain, palpitations, orthopnea and PND.  Gastrointestinal: Negative.   Genitourinary: Negative.   Musculoskeletal: Negative.   Skin: Negative.   Neurological: Negative.  Negative for headaches.  Hematological: Negative.   Psychiatric/Behavioral: Negative.        Objective:   Physical Exam  Vitals reviewed. Constitutional: She is oriented to person, place, and time. She appears well-developed and well-nourished. No distress.  HENT:  Head: Normocephalic and atraumatic.  Mouth/Throat: No oropharyngeal exudate.  Eyes: Conjunctivae are normal. Right eye exhibits no discharge. Left eye exhibits no discharge. No scleral icterus.  Neck: Normal range of motion. Neck supple. No JVD present. No tracheal deviation present. No thyromegaly present.  Cardiovascular: Normal rate, regular rhythm, normal heart sounds and intact distal pulses.  Exam reveals no gallop and no friction rub.   No murmur heard. Pulmonary/Chest: Effort normal and breath sounds normal. No stridor. No respiratory distress. She has no wheezes. She has  no rales. She exhibits no tenderness.  Abdominal: Soft. Bowel sounds are normal. She exhibits no distension and no mass. There is no tenderness. There is no rebound and no guarding.  Musculoskeletal: Normal range of motion. She exhibits no edema and no tenderness.  Lymphadenopathy:    She has no cervical adenopathy.  Neurological: She is oriented to person, place, and time.  Skin: Skin is warm and dry. No rash noted. She is not diaphoretic. No erythema. No pallor.  Psychiatric: She has a normal mood and affect. Her behavior is normal. Judgment and thought content normal.      Lab Results  Component Value Date   WBC 6.8 10/21/2010   HGB 13.2 10/21/2010   HCT 40.7 10/21/2010   PLT 331.0 10/21/2010   GLUCOSE 77 10/21/2010   CHOL 198 10/21/2010   TRIG 82.0 10/21/2010   HDL 41.40 10/21/2010   LDLCALC 140* 10/21/2010   ALT 15 10/21/2010   AST 14 10/21/2010   NA 138 10/21/2010   K 3.9 10/21/2010   CL 103 10/21/2010   CREATININE 0.5 10/21/2010   BUN 8 10/21/2010   CO2 24 10/21/2010   TSH 1.59 10/21/2010   HGBA1C 5.6 10/21/2010      Assessment & Plan:

## 2011-01-23 NOTE — Assessment & Plan Note (Signed)
She is doing well and her BP is controlled, will continue the same

## 2011-01-23 NOTE — Patient Instructions (Signed)

## 2011-05-21 ENCOUNTER — Encounter: Payer: Self-pay | Admitting: Internal Medicine

## 2011-05-21 ENCOUNTER — Ambulatory Visit (INDEPENDENT_AMBULATORY_CARE_PROVIDER_SITE_OTHER): Payer: 59 | Admitting: Internal Medicine

## 2011-05-21 ENCOUNTER — Other Ambulatory Visit (INDEPENDENT_AMBULATORY_CARE_PROVIDER_SITE_OTHER): Payer: 59

## 2011-05-21 VITALS — BP 138/82 | HR 68 | Temp 98.8°F | Resp 16 | Wt 231.2 lb

## 2011-05-21 DIAGNOSIS — I10 Essential (primary) hypertension: Secondary | ICD-10-CM

## 2011-05-21 DIAGNOSIS — E669 Obesity, unspecified: Secondary | ICD-10-CM

## 2011-05-21 DIAGNOSIS — E7800 Pure hypercholesterolemia, unspecified: Secondary | ICD-10-CM

## 2011-05-21 DIAGNOSIS — R7309 Other abnormal glucose: Secondary | ICD-10-CM

## 2011-05-21 DIAGNOSIS — N912 Amenorrhea, unspecified: Secondary | ICD-10-CM

## 2011-05-21 DIAGNOSIS — R739 Hyperglycemia, unspecified: Secondary | ICD-10-CM

## 2011-05-21 LAB — BASIC METABOLIC PANEL
BUN: 15 mg/dL (ref 6–23)
CO2: 28 mEq/L (ref 19–32)
Calcium: 9.2 mg/dL (ref 8.4–10.5)
Creatinine, Ser: 0.7 mg/dL (ref 0.4–1.2)
GFR: 130.48 mL/min (ref >60.00)
Glucose, Bld: 93 mg/dL (ref 70–99)

## 2011-05-21 NOTE — Progress Notes (Signed)
  Subjective:    Patient ID: Caroline Jones, female    DOB: 10-17-1980, 31 y.o.   MRN: [id number]  Hypertension This is a chronic problem. The current episode started more than 1 year ago. The problem has been gradually improving since onset. The problem is controlled. Pertinent negatives include no anxiety, blurred vision, chest pain, headaches, malaise/fatigue, neck pain, orthopnea, palpitations, peripheral edema, PND, shortness of breath or sweats. Past treatments include beta blockers and diuretics. The current treatment provides moderate improvement. Compliance problems include exercise and diet.       Review of Systems  Constitutional: Negative.  Negative for malaise/fatigue.  HENT: Negative.  Negative for neck pain.   Eyes: Negative.  Negative for blurred vision.  Respiratory: Negative.  Negative for shortness of breath.   Cardiovascular: Negative.  Negative for chest pain, palpitations, orthopnea and PND.  Gastrointestinal: Negative.   Genitourinary: Negative.   Musculoskeletal: Negative.   Skin: Negative.   Neurological: Negative.  Negative for headaches.  Hematological: Negative.   Psychiatric/Behavioral: Negative.        Objective:   Physical Exam  Vitals reviewed. Constitutional: She is oriented to person, place, and time. She appears well-developed and well-nourished. No distress.  HENT:  Head: Normocephalic and atraumatic.  Mouth/Throat: Oropharynx is clear and moist. No oropharyngeal exudate.  Eyes: Conjunctivae are normal. Right eye exhibits no discharge. Left eye exhibits no discharge. No scleral icterus.  Neck: Normal range of motion. Neck supple. No JVD present. No tracheal deviation present. No thyromegaly present.  Cardiovascular: Normal rate, regular rhythm, normal heart sounds and intact distal pulses.  Exam reveals no gallop and no friction rub.   No murmur heard. Pulmonary/Chest: Effort normal and breath sounds normal. No stridor. No respiratory  distress. She has no wheezes. She has no rales. She exhibits no tenderness.  Abdominal: Soft. Bowel sounds are normal. She exhibits no distension and no mass. There is no tenderness. There is no rebound and no guarding.  Musculoskeletal: Normal range of motion. She exhibits no edema and no tenderness.  Lymphadenopathy:    She has no cervical adenopathy.  Neurological: She is oriented to person, place, and time.  Skin: Skin is warm and dry. No rash noted. She is not diaphoretic. No erythema. No pallor.  Psychiatric: She has a normal mood and affect. Her behavior is normal. Judgment and thought content normal.      Lab Results  Component Value Date   WBC 6.8 10/21/2010   HGB 13.2 10/21/2010   HCT 40.7 10/21/2010   PLT 331.0 10/21/2010   GLUCOSE 93 05/21/2011   CHOL 198 10/21/2010   TRIG 82.0 10/21/2010   HDL 41.40 10/21/2010   LDLCALC 140* 10/21/2010   ALT 15 10/21/2010   AST 14 10/21/2010   NA 141 05/21/2011   K 4.0 05/21/2011   CL 104 05/21/2011   CREATININE 0.7 05/21/2011   BUN 15 05/21/2011   CO2 28 05/21/2011   TSH 1.59 10/21/2010   HGBA1C 5.8 05/21/2011     Assessment & Plan:

## 2011-05-21 NOTE — Assessment & Plan Note (Signed)
Her BP is well controlled, I will check her lytes and renal function today 

## 2011-05-21 NOTE — Assessment & Plan Note (Signed)
She needs a nutrition consult

## 2011-05-21 NOTE — Assessment & Plan Note (Signed)
I will check her beta-HCG today to screen her for pregnancy

## 2011-05-21 NOTE — Assessment & Plan Note (Signed)
I will check her a1c today 

## 2011-05-21 NOTE — Patient Instructions (Signed)

## 2011-06-24 ENCOUNTER — Ambulatory Visit: Payer: 59 | Admitting: *Deleted

## 2011-09-17 ENCOUNTER — Other Ambulatory Visit (INDEPENDENT_AMBULATORY_CARE_PROVIDER_SITE_OTHER): Payer: 59

## 2011-09-17 ENCOUNTER — Encounter: Payer: Self-pay | Admitting: Internal Medicine

## 2011-09-17 ENCOUNTER — Ambulatory Visit (INDEPENDENT_AMBULATORY_CARE_PROVIDER_SITE_OTHER): Payer: 59 | Admitting: Internal Medicine

## 2011-09-17 VITALS — BP 130/80 | HR 64 | Temp 98.3°F | Resp 16

## 2011-09-17 DIAGNOSIS — I10 Essential (primary) hypertension: Secondary | ICD-10-CM

## 2011-09-17 DIAGNOSIS — R739 Hyperglycemia, unspecified: Secondary | ICD-10-CM

## 2011-09-17 DIAGNOSIS — E7800 Pure hypercholesterolemia, unspecified: Secondary | ICD-10-CM

## 2011-09-17 DIAGNOSIS — M545 Low back pain, unspecified: Secondary | ICD-10-CM

## 2011-09-17 DIAGNOSIS — R7309 Other abnormal glucose: Secondary | ICD-10-CM

## 2011-09-17 DIAGNOSIS — M5126 Other intervertebral disc displacement, lumbar region: Secondary | ICD-10-CM

## 2011-09-17 DIAGNOSIS — N912 Amenorrhea, unspecified: Secondary | ICD-10-CM

## 2011-09-17 HISTORY — DX: Other intervertebral disc displacement, lumbar region: M51.26

## 2011-09-17 LAB — URINALYSIS, ROUTINE W REFLEX MICROSCOPIC
Ketones, ur: NEGATIVE
Leukocytes, UA: NEGATIVE
Specific Gravity, Urine: 1.02 (ref 1.000–1.030)
Total Protein, Urine: NEGATIVE
Urine Glucose: NEGATIVE
pH: 6 (ref 5.0–8.0)

## 2011-09-17 LAB — CBC WITH DIFFERENTIAL/PLATELET
Basophils Relative: 0.5 % (ref 0.0–3.0)
Eosinophils Relative: 0.8 % (ref 0.0–5.0)
HCT: 40.3 % (ref 36.0–46.0)
Lymphs Abs: 1.9 10*3/uL (ref 0.7–4.0)
MCHC: 31.9 g/dL (ref 30.0–36.0)
MCV: 78 fl (ref 78.0–100.0)
Monocytes Absolute: 0.2 10*3/uL (ref 0.1–1.0)
Platelets: 313 10*3/uL (ref 150.0–400.0)
RBC: 5.16 Mil/uL — ABNORMAL HIGH (ref 3.87–5.11)
WBC: 6.7 10*3/uL (ref 4.5–10.5)

## 2011-09-17 LAB — COMPREHENSIVE METABOLIC PANEL
AST: 16 U/L (ref 0–37)
Alkaline Phosphatase: 75 U/L (ref 39–117)
Glucose, Bld: 84 mg/dL (ref 70–99)
Sodium: 136 mEq/L (ref 135–145)
Total Bilirubin: 0.3 mg/dL (ref 0.3–1.2)
Total Protein: 7.5 g/dL (ref 6.0–8.3)

## 2011-09-17 LAB — HEMOGLOBIN A1C: Hgb A1c MFr Bld: 5.6 % (ref 4.6–6.5)

## 2011-09-17 LAB — HCG, QUANTITATIVE, PREGNANCY: hCG, Beta Chain, Quant, S: 1 m[IU]/mL

## 2011-09-17 MED ORDER — HYDROCODONE-ACETAMINOPHEN 5-500 MG PO TABS: 1.0000 | ORAL_TABLET | Freq: Four times a day (QID) | ORAL | Status: DC | PRN

## 2011-09-17 NOTE — Assessment & Plan Note (Signed)
Her BP is well controlled, I will check her lytes and renal function 

## 2011-09-17 NOTE — Progress Notes (Signed)
Subjective:    Patient ID: Caroline Jones, female    DOB: 06-10-1980, 31 y.o.   MRN: [id number]  Back Pain This is a new problem. The current episode started in the past 7 days. The problem occurs constantly. The problem has been gradually improving since onset. The pain is present in the lumbar spine. The quality of the pain is described as stabbing. The pain radiates to the right thigh. The pain is at a severity of 6/10. The pain is moderate. The pain is worse during the day. The symptoms are aggravated by standing. Stiffness is present all day. Pertinent negatives include no abdominal pain, bladder incontinence, bowel incontinence, chest pain, dysuria, fever, headaches, leg pain, numbness, paresis, paresthesias, pelvic pain, perianal numbness, tingling, weakness or weight loss. Risk factors include obesity and lack of exercise. Treatments tried: BC powders, aleve, advil, tylenol, mother-in-law's percocet. The treatment provided moderate relief.      Review of Systems  Constitutional: Negative for fever, chills, weight loss, diaphoresis, activity change, appetite change, fatigue and unexpected weight change.  HENT: Negative.   Eyes: Negative.   Respiratory: Negative.   Cardiovascular: Negative for chest pain, palpitations and leg swelling.  Gastrointestinal: Negative.  Negative for nausea, vomiting, abdominal pain, diarrhea, constipation and bowel incontinence.  Genitourinary: Negative.  Negative for bladder incontinence, dysuria and pelvic pain.  Musculoskeletal: Positive for back pain. Negative for myalgias, joint swelling, arthralgias and gait problem.  Skin: Negative for color change, pallor, rash and wound.  Neurological: Negative.  Negative for tingling, weakness, numbness, headaches and paresthesias.  Hematological: Negative for adenopathy. Does not bruise/bleed easily.  Psychiatric/Behavioral: Negative.        Objective:   Physical Exam  Vitals reviewed. Constitutional:  Vital signs are normal. She appears well-developed and well-nourished.  Non-toxic appearance. She does not have a sickly appearance. She does not appear ill. No distress.  HENT:  Head: Normocephalic and atraumatic.  Mouth/Throat: Oropharynx is clear and moist. No oropharyngeal exudate.  Eyes: Conjunctivae are normal. Right eye exhibits no discharge. Left eye exhibits no discharge. No scleral icterus.  Neck: Normal range of motion. Neck supple. No JVD present. No tracheal deviation present. No thyromegaly present.  Cardiovascular: Normal rate, regular rhythm, normal heart sounds and intact distal pulses.  Exam reveals no gallop and no friction rub.   No murmur heard. Pulmonary/Chest: Effort normal and breath sounds normal. No stridor. No respiratory distress. She has no wheezes. She has no rales. She exhibits no tenderness.  Abdominal: Soft. Bowel sounds are normal. She exhibits no distension and no mass. There is no tenderness. There is no rebound and no guarding.  Musculoskeletal: She exhibits no edema and no tenderness.       Lumbar back: Normal. She exhibits normal range of motion, no tenderness, no bony tenderness, no swelling, no edema, no deformity, no laceration, no pain, no spasm and normal pulse.  Lymphadenopathy:    She has no cervical adenopathy.  Neurological: She is alert. She has normal strength. She displays no atrophy, no tremor and normal reflexes. No cranial nerve deficit or sensory deficit. She exhibits normal muscle tone. She displays a negative Romberg sign. She displays no seizure activity. Coordination and gait normal. She displays no Babinski's sign on the right side. She displays no Babinski's sign on the left side.  Reflex Scores:      Tricep reflexes are 1+ on the right side and 1+ on the left side.      Bicep reflexes are 1+ on  the right side and 1+ on the left side.      Brachioradialis reflexes are 1+ on the right side and 1+ on the left side.      Patellar reflexes  are 1+ on the left side.      Achilles reflexes are 1+ on the right side and 1+ on the left side. Skin: She is not diaphoretic.      Lab Results  Component Value Date   WBC 6.8 10/21/2010   HGB 13.2 10/21/2010   HCT 40.7 10/21/2010   PLT 331.0 10/21/2010   GLUCOSE 93 05/21/2011   CHOL 198 10/21/2010   TRIG 82.0 10/21/2010   HDL 41.40 10/21/2010   LDLCALC 140* 10/21/2010   ALT 15 10/21/2010   AST 14 10/21/2010   NA 141 05/21/2011   K 4.0 05/21/2011   CL 104 05/21/2011   CREATININE 0.7 05/21/2011   BUN 15 05/21/2011   CO2 28 05/21/2011   TSH 1.59 10/21/2010   HGBA1C 5.8 05/21/2011      Assessment & Plan:

## 2011-09-17 NOTE — Addendum Note (Signed)
Addended by: Etta Grandchild on: 09/17/2011 12:10 PM   Modules accepted: Orders

## 2011-09-17 NOTE — Assessment & Plan Note (Signed)
No treatment at this time 

## 2011-09-17 NOTE — Assessment & Plan Note (Signed)
I will check her A1C to see if she has developed DM II 

## 2011-09-17 NOTE — Patient Instructions (Signed)
Back Pain, Adult Low back pain is very common. About 1 in 5 people have back pain.The cause of low back pain is rarely dangerous. The pain often gets better over time.About half of people with a sudden onset of back pain feel better in just 2 weeks. About 8 in 10 people feel better by 6 weeks.  CAUSES Some common causes of back pain include:  Strain of the muscles or ligaments supporting the spine.   Wear and tear (degeneration) of the spinal discs.   Arthritis.   Direct injury to the back.  DIAGNOSIS Most of the time, the direct cause of low back pain is not known.However, back pain can be treated effectively even when the exact cause of the pain is unknown.Answering your caregiver's questions about your overall health and symptoms is one of the most accurate ways to make sure the cause of your pain is not dangerous. If your caregiver needs more information, he or she may order lab work or imaging tests (X-rays or MRIs).However, even if imaging tests show changes in your back, this usually does not require surgery. HOME CARE INSTRUCTIONS For many people, back pain returns.Since low back pain is rarely dangerous, it is often a condition that people can learn to manageon their own.   Remain active. It is stressful on the back to sit or stand in one place. Do not sit, drive, or stand in one place for more than 30 minutes at a time. Take short walks on level surfaces as soon as pain allows.Try to increase the length of time you walk each day.   Do not stay in bed.Resting more than 1 or 2 days can delay your recovery.   Do not avoid exercise or work.Your body is made to move.It is not dangerous to be active, even though your back may hurt.Your back will likely heal faster if you return to being active before your pain is gone.   Pay attention to your body when you bend and lift. Many people have less discomfortwhen lifting if they bend their knees, keep the load close to their  bodies,and avoid twisting. Often, the most comfortable positions are those that put less stress on your recovering back.   Find a comfortable position to sleep. Use a firm mattress and lie on your side with your knees slightly bent. If you lie on your back, put a pillow under your knees.   Only take over-the-counter or prescription medicines as directed by your caregiver. Over-the-counter medicines to reduce pain and inflammation are often the most helpful.Your caregiver may prescribe muscle relaxant drugs.These medicines help dull your pain so you can more quickly return to your normal activities and healthy exercise.   Put ice on the injured area.   Put ice in a plastic bag.   Place a towel between your skin and the bag.   Leave the ice on for 15 to 20 minutes, 3 to 4 times a day for the first 2 to 3 days. After that, ice and heat may be alternated to reduce pain and spasms.   Ask your caregiver about trying back exercises and gentle massage. This may be of some benefit.   Avoid feeling anxious or stressed.Stress increases muscle tension and can worsen back pain.It is important to recognize when you are anxious or stressed and learn ways to manage it.Exercise is a great option.  SEEK MEDICAL CARE IF:  You have pain that is not relieved with rest or medicine.   You have   pain that does not improve in 1 week.   You have new symptoms.   You are generally not feeling well.  SEEK IMMEDIATE MEDICAL CARE IF:   You have pain that radiates from your back into your legs.   You develop new bowel or bladder control problems.   You have unusual weakness or numbness in your arms or legs.   You develop nausea or vomiting.   You develop abdominal pain.   You feel faint.  Document Released: 01/13/2005 Document Revised: 01/02/2011 Document Reviewed: 06/03/2010 [employer] Patient Information 2012 [employer]. 

## 2011-09-17 NOTE — Assessment & Plan Note (Signed)
She has significant LBP so I have offered her an Rx for pain meds, I will check her labs today, if she is not pregnant I will order an MRI (she had an MRI in 2007 and was told that she has a bulging disc in her L spine), she was given pt ed material about LBP

## 2011-09-17 NOTE — Assessment & Plan Note (Signed)
I will check her for pregnancy today with a beta-HCG

## 2011-09-27 ENCOUNTER — Ambulatory Visit
Admission: RE | Admit: 2011-09-27 | Discharge: 2011-09-27 | Disposition: A | Discharge status: Home / Self Care | Payer: 59 | Source: Ambulatory Visit | Attending: Internal Medicine | Admitting: Internal Medicine

## 2011-09-27 DIAGNOSIS — M5126 Other intervertebral disc displacement, lumbar region: Secondary | ICD-10-CM

## 2011-09-27 DIAGNOSIS — M545 Low back pain, unspecified: Secondary | ICD-10-CM

## 2011-09-29 ENCOUNTER — Encounter: Payer: Self-pay | Admitting: Internal Medicine

## 2011-10-01 ENCOUNTER — Ambulatory Visit (INDEPENDENT_AMBULATORY_CARE_PROVIDER_SITE_OTHER): Payer: 59 | Admitting: Internal Medicine

## 2011-10-01 ENCOUNTER — Encounter: Payer: Self-pay | Admitting: Internal Medicine

## 2011-10-01 VITALS — BP 136/82 | HR 77 | Temp 97.5°F | Resp 16 | Wt 228.0 lb

## 2011-10-01 DIAGNOSIS — M545 Low back pain, unspecified: Secondary | ICD-10-CM

## 2011-10-01 DIAGNOSIS — I10 Essential (primary) hypertension: Secondary | ICD-10-CM

## 2011-10-01 DIAGNOSIS — M5126 Other intervertebral disc displacement, lumbar region: Secondary | ICD-10-CM

## 2011-10-01 MED ORDER — IBUPROFEN 600 MG PO TABS: 600.0000 mg | ORAL_TABLET | Freq: Three times a day (TID) | ORAL | Status: AC | PRN

## 2011-10-01 NOTE — Patient Instructions (Signed)
Back Pain, Adult Low back pain is very common. About 1 in 5 people have back pain.The cause of low back pain is rarely dangerous. The pain often gets better over time.About half of people with a sudden onset of back pain feel better in just 2 weeks. About 8 in 10 people feel better by 6 weeks.  CAUSES Some common causes of back pain include:  Strain of the muscles or ligaments supporting the spine.   Wear and tear (degeneration) of the spinal discs.   Arthritis.   Direct injury to the back.  DIAGNOSIS Most of the time, the direct cause of low back pain is not known.However, back pain can be treated effectively even when the exact cause of the pain is unknown.Answering your caregiver's questions about your overall health and symptoms is one of the most accurate ways to make sure the cause of your pain is not dangerous. If your caregiver needs more information, he or she may order lab work or imaging tests (X-rays or MRIs).However, even if imaging tests show changes in your back, this usually does not require surgery. HOME CARE INSTRUCTIONS For many people, back pain returns.Since low back pain is rarely dangerous, it is often a condition that people can learn to manageon their own.   Remain active. It is stressful on the back to sit or stand in one place. Do not sit, drive, or stand in one place for more than 30 minutes at a time. Take short walks on level surfaces as soon as pain allows.Try to increase the length of time you walk each day.   Do not stay in bed.Resting more than 1 or 2 days can delay your recovery.   Do not avoid exercise or work.Your body is made to move.It is not dangerous to be active, even though your back may hurt.Your back will likely heal faster if you return to being active before your pain is gone.   Pay attention to your body when you bend and lift. Many people have less discomfortwhen lifting if they bend their knees, keep the load close to their  bodies,and avoid twisting. Often, the most comfortable positions are those that put less stress on your recovering back.   Find a comfortable position to sleep. Use a firm mattress and lie on your side with your knees slightly bent. If you lie on your back, put a pillow under your knees.   Only take over-the-counter or prescription medicines as directed by your caregiver. Over-the-counter medicines to reduce pain and inflammation are often the most helpful.Your caregiver may prescribe muscle relaxant drugs.These medicines help dull your pain so you can more quickly return to your normal activities and healthy exercise.   Put ice on the injured area.   Put ice in a plastic bag.   Place a towel between your skin and the bag.   Leave the ice on for 15 to 20 minutes, 3 to 4 times a day for the first 2 to 3 days. After that, ice and heat may be alternated to reduce pain and spasms.   Ask your caregiver about trying back exercises and gentle massage. This may be of some benefit.   Avoid feeling anxious or stressed.Stress increases muscle tension and can worsen back pain.It is important to recognize when you are anxious or stressed and learn ways to manage it.Exercise is a great option.  SEEK MEDICAL CARE IF:  You have pain that is not relieved with rest or medicine.   You have   pain that does not improve in 1 week.   You have new symptoms.   You are generally not feeling well.  SEEK IMMEDIATE MEDICAL CARE IF:   You have pain that radiates from your back into your legs.   You develop new bowel or bladder control problems.   You have unusual weakness or numbness in your arms or legs.   You develop nausea or vomiting.   You develop abdominal pain.   You feel faint.  Document Released: 01/13/2005 Document Revised: 01/02/2011 Document Reviewed: 06/03/2010 [employer] Patient Information 2012 [employer]. 

## 2011-10-02 NOTE — Progress Notes (Signed)
Subjective:    Patient ID: Caroline Jones, female    DOB: April 03, 1980, 31 y.o.   MRN: [id number]  Back Pain This is a recurrent problem. The current episode started more than 1 month ago. The problem occurs intermittently. The problem has been gradually improving since onset. The pain is present in the lumbar spine. The quality of the pain is described as aching and shooting. The pain radiates to the right thigh. The pain is at a severity of 6/10. The pain is moderate. The pain is worse during the day. The symptoms are aggravated by bending, standing and twisting. Stiffness is present all day. Associated symptoms include leg pain. Pertinent negatives include no abdominal pain, bladder incontinence, bowel incontinence, chest pain, dysuria, fever, headaches, numbness, paresis, paresthesias, pelvic pain, perianal numbness, tingling, weakness or weight loss. Risk factors include obesity, lack of exercise and sedentary lifestyle. She has tried NSAIDs and analgesics for the symptoms. The treatment provided moderate relief.  Hypertension This is a chronic problem. The current episode started more than 1 year ago. The problem has been gradually improving since onset. The problem is controlled. Pertinent negatives include no anxiety, blurred vision, chest pain, headaches, malaise/fatigue, neck pain, orthopnea, palpitations, peripheral edema, PND, shortness of breath or sweats. Agents associated with hypertension include NSAIDs. Past treatments include beta blockers and diuretics. The current treatment provides moderate improvement. Compliance problems include exercise and diet.       Review of Systems  Constitutional: Negative.  Negative for fever, weight loss and malaise/fatigue.  HENT: Negative.  Negative for neck pain.   Eyes: Negative.  Negative for blurred vision.  Respiratory: Negative.  Negative for shortness of breath.   Cardiovascular: Negative.  Negative for chest pain, palpitations, orthopnea  and PND.  Gastrointestinal: Negative.  Negative for abdominal pain and bowel incontinence.  Genitourinary: Negative.  Negative for bladder incontinence, dysuria and pelvic pain.  Musculoskeletal: Positive for back pain. Negative for myalgias, joint swelling, arthralgias and gait problem.  Skin: Negative.   Neurological: Negative for dizziness, tingling, weakness, numbness, headaches and paresthesias.  Hematological: Negative for adenopathy. Does not bruise/bleed easily.  Psychiatric/Behavioral: Negative.        Objective:   Physical Exam  Vitals reviewed. Constitutional: She is oriented to person, place, and time. She appears well-developed and well-nourished. No distress.  HENT:  Head: Normocephalic and atraumatic.  Mouth/Throat: Oropharynx is clear and moist. No oropharyngeal exudate.  Eyes: Conjunctivae are normal. Right eye exhibits no discharge. Left eye exhibits no discharge. No scleral icterus.  Neck: Normal range of motion. Neck supple. No JVD present. No tracheal deviation present. No thyromegaly present.  Cardiovascular: Normal rate, regular rhythm, normal heart sounds and intact distal pulses.  Exam reveals no gallop and no friction rub.   No murmur heard. Pulmonary/Chest: Effort normal and breath sounds normal. No stridor. No respiratory distress. She has no wheezes. She has no rales. She exhibits no tenderness.  Abdominal: Soft. Bowel sounds are normal. She exhibits no distension and no mass. There is no tenderness. There is no rebound and no guarding.  Musculoskeletal: Normal range of motion. She exhibits no edema and no tenderness.       Lumbar back: Normal. She exhibits normal range of motion, no tenderness, no bony tenderness and no deformity.  Lymphadenopathy:    She has no cervical adenopathy.  Neurological: She is alert and oriented to person, place, and time. She has normal strength. She displays no atrophy, no tremor and normal reflexes. No cranial nerve  deficit or  sensory deficit. She exhibits normal muscle tone. She displays a negative Romberg sign. She displays no seizure activity. Coordination and gait normal. She displays no Babinski's sign on the right side. She displays no Babinski's sign on the left side.  Reflex Scores:      Tricep reflexes are 1+ on the right side and 1+ on the left side.      Bicep reflexes are 1+ on the right side and 1+ on the left side.      Brachioradialis reflexes are 1+ on the right side and 1+ on the left side.      Patellar reflexes are 1+ on the right side and 1+ on the left side.      Achilles reflexes are 1+ on the right side and 1+ on the left side.      - SLR in BLE  Skin: Skin is warm and dry. No rash noted. She is not diaphoretic. No erythema. No pallor.  Psychiatric: She has a normal mood and affect. Her behavior is normal. Judgment and thought content normal.   Mr Lumbar Spine Wo Contrast  09/27/2011  *RADIOLOGY REPORT*  Clinical Data: Low back pain radiating down both thighs.  Weakness. History of fall 2004.  MRI LUMBAR SPINE WITHOUT CONTRAST  Technique:  Multiplanar and multiecho pulse sequences of the lumbar spine were obtained without intravenous contrast.  Comparison: None.  Findings: The numbering convention used for this exam terms L5-S1 as the last full intervertebral disc space above the sacrum.  Bone marrow signal shows suppression of fatty marrow, which is a nonspecific finding, commonly associated with anemia, chronic disease, cigarette smoking, and obesity. Spinal cord terminates posterior to T12-L1.  Vertebral body height preserved.  Sacral Tarlov cyst incidentally noted. 4 mm retrolisthesis of L5 on S1 associated with degenerative disc disease.  Mild dextroconvex curvature with the apex at L4.  L1-L2:  Negative.  L2-L3:  Negative.  L3-L4:  Negative.  L4-L5:  Minimal disc bulging.  No stenosis.  L5-S1: Desiccated disc.  Loss of disc height.  Right paracentral protrusion is present with minimal mass effect  on the descending right S1 nerve.  No neural compression.  Foramina patent.  Central canal patent.  IMPRESSION: Mild lumbar spondylosis.  Small right paracentral L5-S1 disc protrusion could irritate the descending right S1 nerve.   Original Report Authenticated By: Andreas Newport, M.D.    Lab Results  Component Value Date   WBC 6.7 09/17/2011   HGB 12.9 09/17/2011   HCT 40.3 09/17/2011   PLT 313.0 09/17/2011   GLUCOSE 84 09/17/2011   CHOL 198 10/21/2010   TRIG 82.0 10/21/2010   HDL 41.40 10/21/2010   LDLCALC 140* 10/21/2010   ALT 16 09/17/2011   AST 16 09/17/2011   NA 136 09/17/2011   K 3.8 09/17/2011   CL 101 09/17/2011   CREATININE 0.5 09/17/2011   BUN 11 09/17/2011   CO2 28 09/17/2011   TSH 2.76 09/17/2011   HGBA1C 5.6 09/17/2011       Assessment & Plan:

## 2011-10-02 NOTE — Assessment & Plan Note (Signed)
She has persistent LBP with radicular symptoms into her RLE with an MRI that shows a disc bulge, I have asked her to increase the dose of her nsaid and to continue taking vicodin as needed. Also have referred her for PT and to pain management (she might benefit from an [facility])

## 2011-10-02 NOTE — Assessment & Plan Note (Signed)
Some better, she has been taking an OTC dose of advil so I will increase that.

## 2011-10-02 NOTE — Assessment & Plan Note (Signed)
Her BP is well controlled 

## 2011-12-04 ENCOUNTER — Ambulatory Visit: Payer: 59 | Admitting: Internal Medicine

## 2011-12-11 ENCOUNTER — Other Ambulatory Visit: Payer: Self-pay | Admitting: *Deleted

## 2011-12-11 DIAGNOSIS — I10 Essential (primary) hypertension: Secondary | ICD-10-CM

## 2011-12-11 MED ORDER — METOPROLOL-HCTZ ER 25-12.5 MG PO TB24: 1.0000 | ORAL_TABLET | Freq: Every day | ORAL | Status: DC

## 2011-12-11 NOTE — Telephone Encounter (Signed)
R'cd fax from [facility] for refill of Dutoprol.

## 2012-02-16 ENCOUNTER — Ambulatory Visit (INDEPENDENT_AMBULATORY_CARE_PROVIDER_SITE_OTHER): Payer: 59 | Admitting: Internal Medicine

## 2012-02-16 ENCOUNTER — Encounter: Payer: Self-pay | Admitting: Internal Medicine

## 2012-02-16 VITALS — BP 130/88 | HR 68 | Temp 98.8°F | Resp 16 | Wt 221.0 lb

## 2012-02-16 DIAGNOSIS — E669 Obesity, unspecified: Secondary | ICD-10-CM

## 2012-02-16 DIAGNOSIS — M545 Low back pain, unspecified: Secondary | ICD-10-CM

## 2012-02-16 DIAGNOSIS — I10 Essential (primary) hypertension: Secondary | ICD-10-CM

## 2012-02-16 DIAGNOSIS — M5126 Other intervertebral disc displacement, lumbar region: Secondary | ICD-10-CM

## 2012-02-16 DIAGNOSIS — Z23 Encounter for immunization: Secondary | ICD-10-CM

## 2012-02-16 DIAGNOSIS — M533 Sacrococcygeal disorders, not elsewhere classified: Secondary | ICD-10-CM

## 2012-02-16 MED ORDER — HYDROCODONE-ACETAMINOPHEN 5-325 MG PO TABS: 1.0000 | ORAL_TABLET | Freq: Four times a day (QID) | ORAL | Status: DC | PRN

## 2012-02-16 NOTE — Assessment & Plan Note (Signed)
She has lost 7 pounds with diet and exercise

## 2012-02-16 NOTE — Assessment & Plan Note (Signed)
Her BP is well controlled 

## 2012-02-16 NOTE — Patient Instructions (Signed)
Hypertension  As your heart beats, it forces blood through your arteries. This force is your blood pressure. If the pressure is too high, it is called hypertension (HTN) or high blood pressure. HTN is dangerous because you may have it and not know it. High blood pressure may mean that your heart has to work harder to pump blood. Your arteries may be narrow or stiff. The extra work puts you at risk for heart disease, stroke, and other problems.   Blood pressure consists of two numbers, a higher number over a lower, 110/72, for example. It is stated as "110 over 72." The ideal is below 120 for the top number (systolic) and under 80 for the bottom (diastolic). Write down your blood pressure today.  You should pay close attention to your blood pressure if you have certain conditions such as:   Heart failure.   Prior heart attack.   Diabetes   Chronic kidney disease.   Prior stroke.   Multiple risk factors for heart disease.  To see if you have HTN, your blood pressure should be measured while you are seated with your arm held at the level of the heart. It should be measured at least twice. A one-time elevated blood pressure reading (especially in the Emergency Department) does not mean that you need treatment. There may be conditions in which the blood pressure is different between your right and left arms. It is important to see your caregiver soon for a recheck.  Most people have essential hypertension which means that there is not a specific cause. This type of high blood pressure may be lowered by changing lifestyle factors such as:   Stress.   Smoking.   Lack of exercise.   Excessive weight.   Drug/tobacco/alcohol use.   Eating less salt.  Most people do not have symptoms from high blood pressure until it has caused damage to the body. Effective treatment can often prevent, delay or reduce that damage.  TREATMENT   When a cause has been identified, treatment for high blood pressure is directed at the  cause. There are a large number of medications to treat HTN. These fall into several categories, and your caregiver will help you select the medicines that are best for you. Medications may have side effects. You should review side effects with your caregiver.  If your blood pressure stays high after you have made lifestyle changes or started on medicines,    Your medication(s) may need to be changed.   Other problems may need to be addressed.   Be certain you understand your prescriptions, and know how and when to take your medicine.   Be sure to follow up with your caregiver within the time frame advised (usually within two weeks) to have your blood pressure rechecked and to review your medications.   If you are taking more than one medicine to lower your blood pressure, make sure you know how and at what times they should be taken. Taking two medicines at the same time can result in blood pressure that is too low.  SEEK IMMEDIATE MEDICAL CARE IF:   You develop a severe headache, blurred or changing vision, or confusion.   You have unusual weakness or numbness, or a faint feeling.   You have severe chest or abdominal pain, vomiting, or breathing problems.  MAKE SURE YOU:    Understand these instructions.   Will watch your condition.   Will get help right away if you are not doing well   or get worse.  Document Released: 01/13/2005 Document Revised: 04/07/2011 Document Reviewed: 09/03/2007  ExitCare Patient Information 2013 [employer]  Back Pain, Adult  Low back pain is very common. About 1 in 5 people have back pain.The cause of low back pain is rarely dangerous. The pain often gets better over time.About half of people with a sudden onset of back pain feel better in just 2 weeks. About 8 in 10 people feel better by 6 weeks.   CAUSES  Some common causes of back pain include:   Strain of the muscles or ligaments supporting the spine.   Wear and tear (degeneration) of the spinal  discs.   Arthritis.   Direct injury to the back.  DIAGNOSIS  Most of the time, the direct cause of low back pain is not known.However, back pain can be treated effectively even when the exact cause of the pain is unknown.Answering your caregiver's questions about your overall health and symptoms is one of the most accurate ways to make sure the cause of your pain is not dangerous. If your caregiver needs more information, he or she may order lab work or imaging tests (X-rays or MRIs).However, even if imaging tests show changes in your back, this usually does not require surgery.  HOME CARE INSTRUCTIONS  For many people, back pain returns.Since low back pain is rarely dangerous, it is often a condition that people can learn to manageon their own.    Remain active. It is stressful on the back to sit or stand in one place. Do not sit, drive, or stand in one place for more than 30 minutes at a time. Take short walks on level surfaces as soon as pain allows.Try to increase the length of time you walk each day.   Do not stay in bed.Resting more than 1 or 2 days can delay your recovery.   Do not avoid exercise or work.Your body is made to move.It is not dangerous to be active, even though your back may hurt.Your back will likely heal faster if you return to being active before your pain is gone.   Pay attention to your body when you bend and lift. Many people have less discomfortwhen lifting if they bend their knees, keep the load close to their bodies,and avoid twisting. Often, the most comfortable positions are those that put less stress on your recovering back.   Find a comfortable position to sleep. Use a firm mattress and lie on your side with your knees slightly bent. If you lie on your back, put a pillow under your knees.   Only take over-the-counter or prescription medicines as directed by your caregiver. Over-the-counter medicines to reduce pain and inflammation are often the most  helpful.Your caregiver may prescribe muscle relaxant drugs.These medicines help dull your pain so you can more quickly return to your normal activities and healthy exercise.   Put ice on the injured area.   Put ice in a plastic bag.   Place a towel between your skin and the bag.   Leave the ice on for 15 to 20 minutes, 3 to 4 times a day for the first 2 to 3 days. After that, ice and heat may be alternated to reduce pain and spasms.   Ask your caregiver about trying back exercises and gentle massage. This may be of some benefit.   Avoid feeling anxious or stressed.Stress increases muscle tension and can worsen back pain.It is important to recognize when you are anxious or stressed and   learn ways to manage it.Exercise is a great option.  SEEK MEDICAL CARE IF:   You have pain that is not relieved with rest or medicine.   You have pain that does not improve in 1 week.   You have new symptoms.   You are generally not feeling well.  SEEK IMMEDIATE MEDICAL CARE IF:    You have pain that radiates from your back into your legs.   You develop new bowel or bladder control problems.   You have unusual weakness or numbness in your arms or legs.   You develop nausea or vomiting.   You develop abdominal pain.   You feel faint.  Document Released: 01/13/2005 Document Revised: 07/15/2011 Document Reviewed: 06/03/2010  [employer] Patient Information 2013 [employer].

## 2012-02-16 NOTE — Progress Notes (Signed)
Subjective:    Patient ID: Caroline Jones, female    DOB: 10/21/1980, 32 y.o.   MRN: [id number]  Back Pain This is a chronic problem. The current episode started more than 1 year ago. The problem occurs intermittently. The problem is unchanged. The pain is present in the lumbar spine. The quality of the pain is described as stabbing. The pain radiates to the right thigh. The pain is at a severity of 4/10. The pain is moderate. The pain is worse during the day. The symptoms are aggravated by position and bending. Pertinent negatives include no abdominal pain, bladder incontinence, bowel incontinence, chest pain, dysuria, fever, headaches, leg pain, numbness, paresis, paresthesias, pelvic pain, perianal numbness, tingling, weakness or weight loss. Risk factors include obesity and lack of exercise. She has tried NSAIDs and analgesics for the symptoms. The treatment provided significant relief.      Review of Systems  Constitutional: Negative for fever, chills, weight loss, diaphoresis, activity change, appetite change, fatigue and unexpected weight change.  HENT: Negative.   Eyes: Negative.   Respiratory: Negative for cough, choking, chest tightness, shortness of breath, wheezing and stridor.   Cardiovascular: Negative for chest pain, palpitations and leg swelling.  Gastrointestinal: Negative for nausea, vomiting, abdominal pain, diarrhea, constipation, blood in stool and bowel incontinence.  Genitourinary: Negative.  Negative for bladder incontinence, dysuria and pelvic pain.  Musculoskeletal: Positive for back pain. Negative for myalgias, joint swelling, arthralgias and gait problem.  Skin: Negative for color change, pallor, rash and wound.  Neurological: Negative for dizziness, tingling, tremors, seizures, syncope, facial asymmetry, speech difficulty, weakness, light-headedness, numbness, headaches and paresthesias.  Hematological: Negative for adenopathy. Does not bruise/bleed easily.    Psychiatric/Behavioral: Negative.        Objective:   Physical Exam  Vitals reviewed. Constitutional: She is oriented to person, place, and time. She appears well-developed and well-nourished. No distress.  HENT:  Head: Normocephalic and atraumatic.  Mouth/Throat: Oropharynx is clear and moist. No oropharyngeal exudate.  Eyes: Conjunctivae normal are normal. Right eye exhibits no discharge. Left eye exhibits no discharge. No scleral icterus.  Neck: Normal range of motion. Neck supple. No JVD present. No tracheal deviation present. No thyromegaly present.  Cardiovascular: Normal rate, regular rhythm, normal heart sounds and intact distal pulses.  Exam reveals no gallop and no friction rub.   No murmur heard. Pulmonary/Chest: Effort normal and breath sounds normal. No stridor. No respiratory distress. She has no wheezes. She has no rales. She exhibits no tenderness.  Abdominal: Soft. Bowel sounds are normal. She exhibits no distension and no mass. There is no tenderness. There is no rebound and no guarding.  Musculoskeletal:       Lumbar back: Normal. She exhibits normal range of motion, no tenderness, no bony tenderness, no swelling, no edema, no deformity, no laceration, no pain, no spasm and normal pulse.  Lymphadenopathy:    She has no cervical adenopathy.  Neurological: She is alert and oriented to person, place, and time. She displays no atrophy, no tremor and normal reflexes. No cranial nerve deficit or sensory deficit. She exhibits normal muscle tone. She displays no seizure activity. Coordination and gait normal.  Reflex Scores:      Tricep reflexes are 1+ on the right side and 1+ on the left side.      Bicep reflexes are 1+ on the right side and 1+ on the left side.      Brachioradialis reflexes are 1+ on the right side and 1+ on  the left side.      Patellar reflexes are 1+ on the right side and 1+ on the left side.      Achilles reflexes are 1+ on the right side and 1+ on the  left side.      - SLR in BLE  Skin: Skin is warm and dry. No rash noted. She is not diaphoretic. No erythema. No pallor.  Psychiatric: She has a normal mood and affect. Her behavior is normal. Judgment and thought content normal.      Lab Results  Component Value Date   WBC 6.7 09/17/2011   HGB 12.9 09/17/2011   HCT 40.3 09/17/2011   PLT 313.0 09/17/2011   GLUCOSE 84 09/17/2011   CHOL 198 10/21/2010   TRIG 82.0 10/21/2010   HDL 41.40 10/21/2010   LDLCALC 140* 10/21/2010   ALT 16 09/17/2011   AST 16 09/17/2011   NA 136 09/17/2011   K 3.8 09/17/2011   CL 101 09/17/2011   CREATININE 0.5 09/17/2011   BUN 11 09/17/2011   CO2 28 09/17/2011   TSH 2.76 09/17/2011   HGBA1C 5.6 09/17/2011      Assessment & Plan:

## 2012-02-16 NOTE — Assessment & Plan Note (Signed)
She does not want to consider ESI or surgery She does not want to do PT She will continue her current meds and will let me know of any changes in her clinical status

## 2012-05-24 ENCOUNTER — Ambulatory Visit (INDEPENDENT_AMBULATORY_CARE_PROVIDER_SITE_OTHER): Payer: 59 | Admitting: Internal Medicine

## 2012-05-24 ENCOUNTER — Telehealth: Payer: Self-pay | Admitting: Internal Medicine

## 2012-05-24 ENCOUNTER — Encounter: Payer: Self-pay | Admitting: Internal Medicine

## 2012-05-24 ENCOUNTER — Other Ambulatory Visit (INDEPENDENT_AMBULATORY_CARE_PROVIDER_SITE_OTHER): Payer: 59

## 2012-05-24 VITALS — BP 142/110 | HR 67 | Temp 98.5°F | Wt 226.8 lb

## 2012-05-24 DIAGNOSIS — R42 Dizziness and giddiness: Secondary | ICD-10-CM

## 2012-05-24 DIAGNOSIS — I10 Essential (primary) hypertension: Secondary | ICD-10-CM

## 2012-05-24 LAB — BASIC METABOLIC PANEL
CO2: 28 mEq/L (ref 19–32)
Chloride: 103 mEq/L (ref 96–112)
Potassium: 4.1 mEq/L (ref 3.5–5.1)
Sodium: 137 mEq/L (ref 135–145)

## 2012-05-24 LAB — CBC
HCT: 40 % (ref 36.0–46.0)
Hemoglobin: 13.3 g/dL (ref 12.0–15.0)
MCV: 78.2 fl (ref 78.0–100.0)
Platelets: 295 10*3/uL (ref 150.0–400.0)
RBC: 5.12 Mil/uL — ABNORMAL HIGH (ref 3.87–5.11)

## 2012-05-24 NOTE — Patient Instructions (Signed)

## 2012-05-24 NOTE — Telephone Encounter (Signed)
Pt called to follow up on her lab work that was done today. Pt is very concern. Please call pt

## 2012-05-24 NOTE — Assessment & Plan Note (Addendum)
Elevated for the past 2 weeks Will check labs today Based on labs, may double the HCTZ in her Dutoprol versus starting an additional medication Will check serum HCG to r/o pregnancy per patient request  Will f/u with treatment plan after lab results back
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
## 2012-05-25 ENCOUNTER — Other Ambulatory Visit: Payer: Self-pay | Admitting: Internal Medicine

## 2012-05-25 DIAGNOSIS — I10 Essential (primary) hypertension: Secondary | ICD-10-CM

## 2012-05-25 MED ORDER — AMLODIPINE BESYLATE 5 MG PO TABS: 5.0000 mg | ORAL_TABLET | Freq: Every day | ORAL | Status: DC

## 2012-05-25 MED ORDER — METOPROLOL-HCTZ ER 25-12.5 MG PO TB24: 1.0000 | ORAL_TABLET | Freq: Every day | ORAL | Status: DC

## 2012-06-02 ENCOUNTER — Other Ambulatory Visit: Payer: Self-pay | Admitting: *Deleted

## 2012-06-02 MED ORDER — AMLODIPINE BESYLATE 5 MG PO TABS: 5.0000 mg | ORAL_TABLET | Freq: Every day | ORAL | Status: DC

## 2012-06-02 NOTE — Telephone Encounter (Signed)
R'cd fax from [facility] for refill of Amlodipine.  

## 2012-06-15 ENCOUNTER — Other Ambulatory Visit (INDEPENDENT_AMBULATORY_CARE_PROVIDER_SITE_OTHER): Payer: 59

## 2012-06-15 ENCOUNTER — Ambulatory Visit (INDEPENDENT_AMBULATORY_CARE_PROVIDER_SITE_OTHER): Payer: 59 | Admitting: Internal Medicine

## 2012-06-15 ENCOUNTER — Encounter: Payer: Self-pay | Admitting: Internal Medicine

## 2012-06-15 VITALS — BP 122/84 | HR 79 | Temp 98.1°F | Resp 16 | Wt 227.2 lb

## 2012-06-15 DIAGNOSIS — R739 Hyperglycemia, unspecified: Secondary | ICD-10-CM

## 2012-06-15 DIAGNOSIS — I10 Essential (primary) hypertension: Secondary | ICD-10-CM

## 2012-06-15 DIAGNOSIS — R7309 Other abnormal glucose: Secondary | ICD-10-CM

## 2012-06-15 DIAGNOSIS — R10814 Left lower quadrant abdominal tenderness: Secondary | ICD-10-CM

## 2012-06-15 DIAGNOSIS — E876 Hypokalemia: Secondary | ICD-10-CM

## 2012-06-15 LAB — COMPREHENSIVE METABOLIC PANEL
AST: 14 U/L (ref 0–37)
Alkaline Phosphatase: 78 U/L (ref 39–117)
BUN: 12 mg/dL (ref 6–23)
Creatinine, Ser: 0.6 mg/dL (ref 0.4–1.2)

## 2012-06-15 LAB — URINALYSIS, ROUTINE W REFLEX MICROSCOPIC
Ketones, ur: NEGATIVE
RBC / HPF: NONE SEEN (ref 0–?)
Specific Gravity, Urine: 1.025 (ref 1.000–1.030)
Total Protein, Urine: NEGATIVE
Urine Glucose: NEGATIVE
Urobilinogen, UA: 0.2 (ref 0.0–1.0)
WBC, UA: NONE SEEN (ref 0–?)

## 2012-06-15 LAB — CBC WITH DIFFERENTIAL/PLATELET
Basophils Relative: 0.3 % (ref 0.0–3.0)
Eosinophils Absolute: 0.1 10*3/uL (ref 0.0–0.7)
Eosinophils Relative: 1 % (ref 0.0–5.0)
Hemoglobin: 12.9 g/dL (ref 12.0–15.0)
Lymphocytes Relative: 22 % (ref 12.0–46.0)
MCHC: 33.1 g/dL (ref 30.0–36.0)
MCV: 77.3 fl — ABNORMAL LOW (ref 78.0–100.0)
Monocytes Absolute: 0.3 10*3/uL (ref 0.1–1.0)
Neutro Abs: 5.4 10*3/uL (ref 1.4–7.7)
RBC: 5.04 Mil/uL (ref 3.87–5.11)
WBC: 7.4 10*3/uL (ref 4.5–10.5)

## 2012-06-15 LAB — HEMOGLOBIN A1C: Hgb A1c MFr Bld: 5.8 % (ref 4.6–6.5)

## 2012-06-15 MED ORDER — POTASSIUM CHLORIDE CRYS ER 20 MEQ PO TBCR: 20.0000 meq | EXTENDED_RELEASE_TABLET | Freq: Three times a day (TID) | ORAL | Status: DC

## 2012-06-15 NOTE — Patient Instructions (Signed)
Abdominal Pain  Abdominal pain can be caused by many things. Your caregiver decides the seriousness of your pain by an examination and possibly blood tests and X-rays. Many cases can be observed and treated at home. Most abdominal pain is not caused by a disease and will probably improve without treatment. However, in many cases, more time must pass before a clear cause of the pain can be found. Before that point, it may not be known if you need more testing, or if hospitalization or surgery is needed.  HOME CARE INSTRUCTIONS   · Do not take laxatives unless directed by your caregiver.  · Take pain medicine only as directed by your caregiver.  · Only take over-the-counter or prescription medicines for pain, discomfort, or fever as directed by your caregiver.  · Try a clear liquid diet (broth, tea, or water) for as long as directed by your caregiver. Slowly move to a bland diet as tolerated.  SEEK IMMEDIATE MEDICAL CARE IF:   · The pain does not go away.  · You have a fever.  · You keep throwing up (vomiting).  · The pain is felt only in portions of the abdomen. Pain in the right side could possibly be appendicitis. In an adult, pain in the left lower portion of the abdomen could be colitis or diverticulitis.  · You pass bloody or black tarry stools.  MAKE SURE YOU:   · Understand these instructions.  · Will watch your condition.  · Will get help right away if you are not doing well or get worse.  Document Released: 10/23/2004 Document Revised: 04/07/2011 Document Reviewed: 09/01/2007  [employer]® Patient Information ©2013 [employer].

## 2012-06-15 NOTE — Progress Notes (Signed)
Subjective:    Patient ID: Caroline Jones, female    DOB: 20-Sep-1980, 32 y.o.   MRN: [id number]  Abdominal Pain This is a recurrent problem. Episode onset: 6 weeks ago. The onset quality is gradual. The problem occurs intermittently. The problem has been unchanged. The pain is located in the LLQ. The pain is mild. Quality: "I feel like I have a butterfly in my abdomen" The abdominal pain does not radiate. Associated symptoms include nausea. Pertinent negatives include no anorexia, arthralgias, belching, constipation, diarrhea, dysuria, fever, flatus, frequency, headaches, hematochezia, hematuria, melena, myalgias, vomiting or weight loss. Nothing aggravates the pain. The pain is relieved by nothing. She has tried nothing for the symptoms.      Review of Systems  Constitutional: Negative.  Negative for fever and weight loss.  HENT: Negative.   Eyes: Negative.   Respiratory: Negative.  Negative for apnea, cough, choking, chest tightness, shortness of breath, wheezing and stridor.   Cardiovascular: Negative.  Negative for chest pain, palpitations and leg swelling.  Gastrointestinal: Positive for nausea and abdominal pain. Negative for vomiting, diarrhea, constipation, melena, hematochezia, abdominal distention, anal bleeding, rectal pain, anorexia and flatus.  Endocrine: Negative.   Genitourinary: Negative.  Negative for dysuria, urgency, frequency, hematuria, flank pain, decreased urine volume, vaginal bleeding, vaginal discharge, enuresis, difficulty urinating, genital sores, vaginal pain, menstrual problem, pelvic pain and dyspareunia.  Musculoskeletal: Negative.  Negative for myalgias, back pain, joint swelling, arthralgias and gait problem.  Skin: Negative.   Allergic/Immunologic: Negative.   Neurological: Positive for dizziness. Negative for tremors, seizures, syncope, facial asymmetry, speech difficulty, weakness, light-headedness, numbness and headaches.  Hematological: Negative.   Negative for adenopathy. Does not bruise/bleed easily.  Psychiatric/Behavioral: Negative.        Objective:   Physical Exam  Vitals reviewed. Constitutional: She is oriented to person, place, and time. She appears well-developed and well-nourished.  Non-toxic appearance. She does not have a sickly appearance. She does not appear ill. No distress.  HENT:  Head: Normocephalic and atraumatic.  Mouth/Throat: Oropharynx is clear and moist. No oropharyngeal exudate.  Eyes: Conjunctivae are normal. Right eye exhibits no discharge. Left eye exhibits no discharge. No scleral icterus.  Neck: Normal range of motion. Neck supple. No JVD present. No tracheal deviation present. No thyromegaly present.  Cardiovascular: Normal rate, regular rhythm, normal heart sounds and intact distal pulses.  Exam reveals no gallop and no friction rub.   No murmur heard. Pulmonary/Chest: Effort normal and breath sounds normal. No stridor. No respiratory distress. She has no wheezes. She has no rales. She exhibits no tenderness.  Abdominal: Soft. Bowel sounds are normal. She exhibits no distension and no mass. There is no tenderness. There is no rebound and no guarding.  Musculoskeletal: Normal range of motion. She exhibits no edema and no tenderness.  Lymphadenopathy:    She has no cervical adenopathy.  Neurological: She is oriented to person, place, and time.  Skin: Skin is warm and dry. No rash noted. She is not diaphoretic. No erythema. No pallor.  Psychiatric: She has a normal mood and affect. Her behavior is normal. Judgment and thought content normal.     Lab Results  Component Value Date   WBC 8.4 05/24/2012   HGB 13.3 05/24/2012   HCT 40.0 05/24/2012   PLT 295.0 05/24/2012   GLUCOSE 85 05/24/2012   CHOL 198 10/21/2010   TRIG 82.0 10/21/2010   HDL 41.40 10/21/2010   LDLCALC 140* 10/21/2010   ALT 16 09/17/2011   AST 16  09/17/2011   NA 137 05/24/2012   K 4.1 05/24/2012   CL 103 05/24/2012   CREATININE 0.5  05/24/2012   BUN 10 05/24/2012   CO2 28 05/24/2012   TSH 2.76 09/17/2011   HGBA1C 5.6 09/17/2011       Assessment & Plan:

## 2012-06-16 ENCOUNTER — Encounter: Payer: Self-pay | Admitting: Internal Medicine

## 2012-06-16 NOTE — Assessment & Plan Note (Signed)
Her BP is well controlled She wants to to stop the CCB due to mild edema in her ankles

## 2012-06-16 NOTE — Assessment & Plan Note (Signed)
I will check her labs to look for secondary causes I have ordered an U/S to see if she has an ovarian cyst/tumor

## 2012-06-16 NOTE — Assessment & Plan Note (Signed)
This is caused by the HCTZ She will start K+ replacements

## 2012-06-22 ENCOUNTER — Ambulatory Visit
Admission: RE | Admit: 2012-06-22 | Discharge: 2012-06-22 | Disposition: A | Discharge status: Home / Self Care | Payer: 59 | Source: Ambulatory Visit | Attending: Internal Medicine | Admitting: Internal Medicine

## 2012-06-22 DIAGNOSIS — R10814 Left lower quadrant abdominal tenderness: Secondary | ICD-10-CM

## 2012-07-07 ENCOUNTER — Encounter: Payer: Self-pay | Admitting: Internal Medicine

## 2012-07-07 ENCOUNTER — Ambulatory Visit (INDEPENDENT_AMBULATORY_CARE_PROVIDER_SITE_OTHER): Payer: 59 | Admitting: Internal Medicine

## 2012-07-07 VITALS — BP 108/80 | HR 71 | Temp 98.6°F | Resp 16 | Wt 228.8 lb

## 2012-07-07 DIAGNOSIS — I10 Essential (primary) hypertension: Secondary | ICD-10-CM

## 2012-07-07 NOTE — Progress Notes (Signed)
  Subjective:    Patient ID: Caroline Jones, female    DOB: 09-Jul-1980, 32 y.o.   MRN: [id number]  Hypertension This is a chronic problem. The current episode started more than 1 year ago. The problem has been gradually improving since onset. The problem is controlled. Pertinent negatives include no anxiety, blurred vision, chest pain, headaches, malaise/fatigue, neck pain, orthopnea, palpitations, PND, shortness of breath or sweats. Past treatments include beta blockers, calcium channel blockers and diuretics. The current treatment provides moderate improvement. Compliance problems include exercise and diet.       Review of Systems  Constitutional: Negative.  Negative for fever, chills, malaise/fatigue, diaphoresis and fatigue.  HENT: Negative.  Negative for neck pain.   Eyes: Negative.  Negative for blurred vision.  Respiratory: Negative.  Negative for shortness of breath.   Cardiovascular: Negative.  Negative for chest pain, palpitations, orthopnea, leg swelling and PND.  Gastrointestinal: Negative.  Negative for abdominal pain.  Endocrine: Negative.   Genitourinary: Negative.  Negative for vaginal bleeding, vaginal discharge and menstrual problem.  Musculoskeletal: Negative.   Skin: Negative.   Allergic/Immunologic: Negative.   Neurological: Negative.  Negative for headaches.  Hematological: Negative.   Psychiatric/Behavioral: Negative.        Objective:   Physical Exam  Vitals reviewed. Constitutional: She is oriented to person, place, and time. She appears well-developed and well-nourished. No distress.  HENT:  Head: Normocephalic and atraumatic.  Mouth/Throat: Oropharynx is clear and moist. No oropharyngeal exudate.  Eyes: Conjunctivae are normal. Right eye exhibits no discharge. Left eye exhibits no discharge. No scleral icterus.  Neck: Normal range of motion. Neck supple. No JVD present. No tracheal deviation present. No thyromegaly present.  Cardiovascular: Normal  rate, regular rhythm, normal heart sounds and intact distal pulses.  Exam reveals no gallop and no friction rub.   No murmur heard. Pulmonary/Chest: Effort normal and breath sounds normal. No stridor. No respiratory distress. She has no wheezes. She has no rales. She exhibits no tenderness.  Abdominal: Soft. Bowel sounds are normal. She exhibits no distension and no mass. There is no tenderness. There is no rebound and no guarding.  Musculoskeletal: Normal range of motion. She exhibits no edema and no tenderness.  Lymphadenopathy:    She has no cervical adenopathy.  Neurological: She is oriented to person, place, and time.  Skin: Skin is warm and dry. No rash noted. She is not diaphoretic. No erythema. No pallor.  Psychiatric: She has a normal mood and affect. Her behavior is normal. Judgment and thought content normal.     Lab Results  Component Value Date   WBC 7.4 06/15/2012   HGB 12.9 06/15/2012   HCT 38.9 06/15/2012   PLT 352.0 06/15/2012   GLUCOSE 90 06/15/2012   CHOL 198 10/21/2010   TRIG 82.0 10/21/2010   HDL 41.40 10/21/2010   LDLCALC 140* 10/21/2010   ALT 14 06/15/2012   AST 14 06/15/2012   NA 137 06/15/2012   K 3.3* 06/15/2012   CL 101 06/15/2012   CREATININE 0.6 06/15/2012   BUN 12 06/15/2012   CO2 30 06/15/2012   TSH 2.76 09/17/2011   HGBA1C 5.8 06/15/2012       Assessment & Plan:

## 2012-07-07 NOTE — Assessment & Plan Note (Signed)
Her BP is well controlled 

## 2012-11-10 ENCOUNTER — Ambulatory Visit: Payer: 59 | Admitting: Internal Medicine

## 2012-11-19 ENCOUNTER — Encounter: Payer: Self-pay | Admitting: Internal Medicine

## 2012-11-19 ENCOUNTER — Ambulatory Visit (INDEPENDENT_AMBULATORY_CARE_PROVIDER_SITE_OTHER): Payer: 59 | Admitting: Internal Medicine

## 2012-11-19 ENCOUNTER — Other Ambulatory Visit (INDEPENDENT_AMBULATORY_CARE_PROVIDER_SITE_OTHER): Payer: 59

## 2012-11-19 VITALS — BP 134/80 | HR 73 | Temp 98.1°F | Resp 16 | Wt 228.8 lb

## 2012-11-19 DIAGNOSIS — E7800 Pure hypercholesterolemia, unspecified: Secondary | ICD-10-CM

## 2012-11-19 DIAGNOSIS — R7309 Other abnormal glucose: Secondary | ICD-10-CM

## 2012-11-19 DIAGNOSIS — R739 Hyperglycemia, unspecified: Secondary | ICD-10-CM

## 2012-11-19 DIAGNOSIS — Z23 Encounter for immunization: Secondary | ICD-10-CM

## 2012-11-19 DIAGNOSIS — I10 Essential (primary) hypertension: Secondary | ICD-10-CM

## 2012-11-19 DIAGNOSIS — E876 Hypokalemia: Secondary | ICD-10-CM

## 2012-11-19 LAB — TSH: TSH: 1.39 u[IU]/mL (ref 0.35–5.50)

## 2012-11-19 LAB — BASIC METABOLIC PANEL
BUN: 13 mg/dL (ref 6–23)
CO2: 29 mEq/L (ref 19–32)
Calcium: 9.2 mg/dL (ref 8.4–10.5)
Creatinine, Ser: 0.5 mg/dL (ref 0.4–1.2)

## 2012-11-19 LAB — MAGNESIUM: Magnesium: 1.8 mg/dL (ref 1.5–2.5)

## 2012-11-19 LAB — LIPID PANEL
LDL Cholesterol: 132 mg/dL — ABNORMAL HIGH (ref 0–99)
Total CHOL/HDL Ratio: 5
VLDL: 14.4 mg/dL (ref 0.0–40.0)

## 2012-11-19 LAB — HEMOGLOBIN A1C: Hgb A1c MFr Bld: 6 % (ref 4.6–6.5)

## 2012-11-19 MED ORDER — METOPROLOL-HCTZ ER 25-12.5 MG PO TB24: 1.0000 | ORAL_TABLET | Freq: Every day | ORAL | Status: DC

## 2012-11-19 MED ORDER — POTASSIUM CHLORIDE CRYS ER 20 MEQ PO TBCR: 20.0000 meq | EXTENDED_RELEASE_TABLET | Freq: Two times a day (BID) | ORAL | Status: DC

## 2012-11-19 NOTE — Assessment & Plan Note (Signed)
She adequate BP control She has stopped amlodipine due to edema She will improve on her lifestyle modifications

## 2012-11-19 NOTE — Progress Notes (Signed)
Subjective:    Patient ID: Caroline Jones, female    DOB: 1980/03/13, 32 y.o.   MRN: [id number]  Hypertension This is a chronic problem. The current episode started more than 1 year ago. The problem is unchanged. The problem is controlled. Pertinent negatives include no anxiety, blurred vision, chest pain, headaches, malaise/fatigue, neck pain, orthopnea, palpitations, peripheral edema, PND, shortness of breath or sweats. Past treatments include beta blockers, diuretics and calcium channel blockers. The current treatment provides moderate improvement. Compliance problems include exercise, medication side effects and diet.       Review of Systems  Constitutional: Negative.  Negative for fever, chills, malaise/fatigue, diaphoresis, appetite change and fatigue.  HENT: Negative.   Eyes: Negative.  Negative for blurred vision.  Respiratory: Negative.  Negative for cough, choking, chest tightness, shortness of breath, wheezing and stridor.   Cardiovascular: Negative.  Negative for chest pain, palpitations, orthopnea, leg swelling and PND.  Gastrointestinal: Negative.  Negative for abdominal pain.  Endocrine: Negative.   Genitourinary: Negative.   Musculoskeletal: Negative.  Negative for neck pain.  Skin: Negative.   Allergic/Immunologic: Negative.   Neurological: Negative for dizziness, tremors, weakness, light-headedness, numbness and headaches.  Hematological: Negative.  Negative for adenopathy. Does not bruise/bleed easily.  Psychiatric/Behavioral: Negative.        Objective:   Physical Exam  Vitals reviewed. Constitutional: She is oriented to person, place, and time. She appears well-developed and well-nourished. No distress.  HENT:  Head: Normocephalic and atraumatic.  Mouth/Throat: Oropharynx is clear and moist. No oropharyngeal exudate.  Cardiovascular: Normal rate, regular rhythm, S1 normal, S2 normal and intact distal pulses.  Exam reveals no gallop, no S3, no S4 and no  friction rub.   Murmur heard.  Decrescendo systolic murmur is present with a grade of 1/6   No diastolic murmur is present  Pulses:      Carotid pulses are 1+ on the right side, and 1+ on the left side.      Radial pulses are 1+ on the right side, and 1+ on the left side.       Femoral pulses are 1+ on the right side, and 1+ on the left side.      Popliteal pulses are 1+ on the right side, and 1+ on the left side.       Dorsalis pedis pulses are 1+ on the right side, and 1+ on the left side.       Posterior tibial pulses are 1+ on the right side, and 1+ on the left side.  Soft mid-systolic murmur over the LLSB  Pulmonary/Chest: Effort normal and breath sounds normal. No respiratory distress. She has no wheezes. She has no rales. She exhibits no tenderness.  Abdominal: Soft. Bowel sounds are normal. She exhibits no distension and no mass. There is no tenderness. There is no rebound and no guarding.  Musculoskeletal: Normal range of motion. She exhibits no edema and no tenderness.  Neurological: She is oriented to person, place, and time.  Skin: Skin is warm and dry. No rash noted. She is not diaphoretic. No erythema. No pallor.  Psychiatric: She has a normal mood and affect. Her behavior is normal. Judgment and thought content normal.     Lab Results  Component Value Date   WBC 7.4 06/15/2012   HGB 12.9 06/15/2012   HCT 38.9 06/15/2012   PLT 352.0 06/15/2012   GLUCOSE 90 06/15/2012   CHOL 198 10/21/2010   TRIG 82.0 10/21/2010   HDL 41.40 10/21/2010  LDLCALC 140* 10/21/2010   ALT 14 06/15/2012   AST 14 06/15/2012   NA 137 06/15/2012   K 3.3* 06/15/2012   CL 101 06/15/2012   CREATININE 0.6 06/15/2012   BUN 12 06/15/2012   CO2 30 06/15/2012   TSH 2.76 09/17/2011   HGBA1C 5.8 06/15/2012       Assessment & Plan:

## 2012-11-19 NOTE — Patient Instructions (Signed)

## 2012-11-19 NOTE — Assessment & Plan Note (Signed)
She will restart the K+ replacement therapy I will check her K+ and Mg++ levels today

## 2012-11-19 NOTE — Assessment & Plan Note (Signed)
FLP today Statin is contraindicated doe to the risk of pregnancy

## 2012-12-16 ENCOUNTER — Ambulatory Visit: Payer: 59 | Admitting: Obstetrics & Gynecology

## 2013-01-03 ENCOUNTER — Ambulatory Visit: Payer: Self-pay | Admitting: Obstetrics & Gynecology

## 2013-02-24 ENCOUNTER — Ambulatory Visit: Payer: 59 | Admitting: Internal Medicine

## 2013-03-14 ENCOUNTER — Ambulatory Visit: Payer: 59 | Admitting: Internal Medicine

## 2013-03-17 ENCOUNTER — Ambulatory Visit: Payer: 59 | Admitting: Internal Medicine

## 2013-03-31 ENCOUNTER — Ambulatory Visit: Payer: 59 | Admitting: Internal Medicine

## 2013-04-14 ENCOUNTER — Ambulatory Visit: Payer: 59 | Admitting: Internal Medicine

## 2013-06-14 ENCOUNTER — Encounter: Payer: Self-pay | Admitting: Internal Medicine

## 2013-06-14 ENCOUNTER — Other Ambulatory Visit (INDEPENDENT_AMBULATORY_CARE_PROVIDER_SITE_OTHER): Payer: 59

## 2013-06-14 ENCOUNTER — Ambulatory Visit (INDEPENDENT_AMBULATORY_CARE_PROVIDER_SITE_OTHER): Payer: 59 | Admitting: Internal Medicine

## 2013-06-14 ENCOUNTER — Telehealth: Payer: Self-pay | Admitting: Internal Medicine

## 2013-06-14 VITALS — BP 140/100 | HR 67 | Temp 97.5°F | Resp 16 | Ht 65.0 in | Wt 226.0 lb

## 2013-06-14 DIAGNOSIS — E876 Hypokalemia: Secondary | ICD-10-CM

## 2013-06-14 DIAGNOSIS — I10 Essential (primary) hypertension: Secondary | ICD-10-CM

## 2013-06-14 LAB — BASIC METABOLIC PANEL
BUN: 12 mg/dL (ref 6–23)
CHLORIDE: 104 meq/L (ref 96–112)
CO2: 29 meq/L (ref 19–32)
Calcium: 9.1 mg/dL (ref 8.4–10.5)
Creatinine, Ser: 0.6 mg/dL (ref 0.4–1.2)
GFR: 146.38 mL/min (ref >60.00)
Glucose, Bld: 89 mg/dL (ref 70–99)
POTASSIUM: 3.5 meq/L (ref 3.5–5.1)
Sodium: 140 mEq/L (ref 135–145)

## 2013-06-14 LAB — MAGNESIUM: MAGNESIUM: 2.1 mg/dL (ref 1.5–2.5)

## 2013-06-14 MED ORDER — NEBIVOLOL HCL 10 MG PO TABS: 10.0000 mg | ORAL_TABLET | Freq: Every day | ORAL | Status: DC

## 2013-06-14 MED ORDER — TRIAMTERENE-HCTZ 50-25 MG PO CAPS
1.0000 | ORAL_CAPSULE | ORAL | Status: DC
Start: 2013-06-14 — End: 2013-06-14

## 2013-06-14 MED ORDER — TRIAMTERENE-HCTZ 50-25 MG PO CAPS: 1.0000 | ORAL_CAPSULE | ORAL | Status: DC

## 2013-06-14 NOTE — Progress Notes (Signed)
Pre visit review using our clinic review tool, if applicable. No additional management support is needed unless otherwise documented below in the visit note. 

## 2013-06-14 NOTE — Telephone Encounter (Signed)
Relevant patient education assigned to patient using Emmi. ° °

## 2013-06-14 NOTE — Assessment & Plan Note (Signed)
Her BP is not well controlled I have asked her to stop the nsaids I think Bystolic + Dyazide is a better combo for BP control I will monitor her lytes and renal function today

## 2013-06-14 NOTE — Progress Notes (Signed)
   Subjective:    Patient ID: Caroline Jones, female    DOB: [birth year], 33 y.o.   MRN: [id number]  Hypertension This is a chronic problem. The current episode started more than 1 year ago. The problem is unchanged. The problem is uncontrolled. Associated symptoms include peripheral edema. Pertinent negatives include no anxiety, blurred vision, chest pain, headaches, malaise/fatigue, neck pain, orthopnea, palpitations, PND, shortness of breath or sweats. Agents associated with hypertension include NSAIDs. Past treatments include beta blockers and diuretics. The current treatment provides moderate improvement. Compliance problems include diet and exercise.       Review of Systems  Constitutional: Negative.  Negative for fever, chills, malaise/fatigue, diaphoresis, appetite change and fatigue.  HENT: Negative.   Eyes: Negative.  Negative for blurred vision.  Respiratory: Negative.  Negative for cough, choking, chest tightness, shortness of breath and stridor.   Cardiovascular: Negative.  Negative for chest pain, palpitations, orthopnea, leg swelling and PND.  Gastrointestinal: Negative.  Negative for nausea, vomiting, abdominal pain, diarrhea, constipation and blood in stool.  Endocrine: Negative.   Genitourinary: Negative.   Musculoskeletal: Negative.  Negative for arthralgias, back pain, gait problem, neck pain and neck stiffness.  Skin: Negative.   Allergic/Immunologic: Negative.   Neurological: Negative.  Negative for headaches.  Hematological: Negative.  Negative for adenopathy. Does not bruise/bleed easily.  Psychiatric/Behavioral: Negative.        Objective:   Physical Exam  Vitals reviewed. Constitutional: She is oriented to person, place, and time. She appears well-developed and well-nourished. No distress.  HENT:  Head: Normocephalic and atraumatic.  Mouth/Throat: Oropharynx is clear and moist. No oropharyngeal exudate.  Eyes: Conjunctivae are normal. Right eye exhibits  no discharge. Left eye exhibits no discharge. No scleral icterus.  Neck: Normal range of motion. Neck supple. No JVD present. No tracheal deviation present. No thyromegaly present.  Cardiovascular: Normal rate, regular rhythm, normal heart sounds and intact distal pulses.  Exam reveals no gallop and no friction rub.   No murmur heard. Pulmonary/Chest: Effort normal and breath sounds normal. No stridor. No respiratory distress. She has no wheezes. She has no rales. She exhibits no tenderness.  Abdominal: Soft. Bowel sounds are normal. She exhibits no distension and no mass. There is no tenderness. There is no rebound and no guarding.  Musculoskeletal: Normal range of motion. She exhibits edema (there is trace edema over both ankles). She exhibits no tenderness.  Lymphadenopathy:    She has no cervical adenopathy.  Neurological: She is oriented to person, place, and time.  Skin: Skin is warm and dry. No rash noted. She is not diaphoretic. No erythema. No pallor.  Psychiatric: She has a normal mood and affect. Her behavior is normal. Judgment and thought content normal.     Lab Results  Component Value Date   WBC 7.4 06/15/2012   HGB 12.9 06/15/2012   HCT 38.9 06/15/2012   PLT 352.0 06/15/2012   GLUCOSE 77 11/19/2012   CHOL 187 11/19/2012   TRIG 72.0 11/19/2012   HDL 40.60 11/19/2012   LDLCALC 132* 11/19/2012   ALT 14 06/15/2012   AST 14 06/15/2012   NA 138 11/19/2012   K 3.3* 11/19/2012   CL 101 11/19/2012   CREATININE 0.5 11/19/2012   BUN 13 11/19/2012   CO2 29 11/19/2012   TSH 1.39 11/19/2012   HGBA1C 6.0 11/19/2012       Assessment & Plan:

## 2013-06-14 NOTE — Assessment & Plan Note (Signed)
Will change the HCTZ to dyazide to help with this

## 2013-06-14 NOTE — Patient Instructions (Signed)

## 2013-09-14 ENCOUNTER — Ambulatory Visit: Payer: 59 | Admitting: Internal Medicine

## 2013-10-07 ENCOUNTER — Encounter: Payer: Self-pay | Admitting: Internal Medicine

## 2013-10-07 ENCOUNTER — Other Ambulatory Visit (INDEPENDENT_AMBULATORY_CARE_PROVIDER_SITE_OTHER): Payer: 59

## 2013-10-07 ENCOUNTER — Ambulatory Visit (INDEPENDENT_AMBULATORY_CARE_PROVIDER_SITE_OTHER): Payer: 59 | Admitting: Internal Medicine

## 2013-10-07 ENCOUNTER — Ambulatory Visit (HOSPITAL_BASED_OUTPATIENT_CLINIC_OR_DEPARTMENT_OTHER)
Admission: RE | Admit: 2013-10-07 | Discharge: 2013-10-07 | Disposition: A | Discharge status: Home / Self Care | Payer: 59 | Source: Ambulatory Visit | Attending: Internal Medicine | Admitting: Internal Medicine

## 2013-10-07 VITALS — BP 116/82 | HR 59 | Temp 98.4°F | Resp 16 | Ht 65.0 in | Wt 230.1 lb

## 2013-10-07 DIAGNOSIS — I10 Essential (primary) hypertension: Secondary | ICD-10-CM

## 2013-10-07 DIAGNOSIS — R7309 Other abnormal glucose: Secondary | ICD-10-CM

## 2013-10-07 DIAGNOSIS — R079 Chest pain, unspecified: Secondary | ICD-10-CM | POA: Insufficient documentation

## 2013-10-07 DIAGNOSIS — E876 Hypokalemia: Secondary | ICD-10-CM

## 2013-10-07 DIAGNOSIS — R739 Hyperglycemia, unspecified: Secondary | ICD-10-CM

## 2013-10-07 DIAGNOSIS — R109 Unspecified abdominal pain: Secondary | ICD-10-CM

## 2013-10-07 DIAGNOSIS — R0789 Other chest pain: Secondary | ICD-10-CM

## 2013-10-07 DIAGNOSIS — E7800 Pure hypercholesterolemia, unspecified: Secondary | ICD-10-CM

## 2013-10-07 DIAGNOSIS — R7989 Other specified abnormal findings of blood chemistry: Secondary | ICD-10-CM

## 2013-10-07 DIAGNOSIS — K219 Gastro-esophageal reflux disease without esophagitis: Secondary | ICD-10-CM | POA: Insufficient documentation

## 2013-10-07 DIAGNOSIS — R791 Abnormal coagulation profile: Secondary | ICD-10-CM

## 2013-10-07 LAB — COMPREHENSIVE METABOLIC PANEL
ALT: 13 U/L (ref 0–35)
AST: 12 U/L (ref 0–37)
Albumin: 3.3 g/dL — ABNORMAL LOW (ref 3.5–5.2)
Alkaline Phosphatase: 77 U/L (ref 39–117)
BILIRUBIN TOTAL: 0.5 mg/dL (ref 0.2–1.2)
BUN: 9 mg/dL (ref 6–23)
CO2: 31 meq/L (ref 19–32)
CREATININE: 0.6 mg/dL (ref 0.4–1.2)
Calcium: 9.2 mg/dL (ref 8.4–10.5)
Chloride: 100 mEq/L (ref 96–112)
GFR: 155.02 mL/min (ref >60.00)
GLUCOSE: 82 mg/dL (ref 70–99)
Potassium: 3.8 mEq/L (ref 3.5–5.1)
Sodium: 138 mEq/L (ref 135–145)
Total Protein: 7.9 g/dL (ref 6.0–8.3)

## 2013-10-07 LAB — CBC WITH DIFFERENTIAL/PLATELET
BASOS ABS: 0 10*3/uL (ref 0.0–0.1)
Basophils Relative: 0.3 % (ref 0.0–3.0)
EOS PCT: 0.7 % (ref 0.0–5.0)
Eosinophils Absolute: 0 10*3/uL (ref 0.0–0.7)
HEMATOCRIT: 39.8 % (ref 36.0–46.0)
HEMOGLOBIN: 12.8 g/dL (ref 12.0–15.0)
LYMPHS ABS: 1.4 10*3/uL (ref 0.7–4.0)
Lymphocytes Relative: 26.3 % (ref 12.0–46.0)
MCHC: 32.2 g/dL (ref 30.0–36.0)
MCV: 79.4 fl (ref 78.0–100.0)
MONOS PCT: 4.9 % (ref 3.0–12.0)
Monocytes Absolute: 0.3 10*3/uL (ref 0.1–1.0)
NEUTROS ABS: 3.6 10*3/uL (ref 1.4–7.7)
Neutrophils Relative %: 67.8 % (ref 43.0–77.0)
Platelets: 343 10*3/uL (ref 150.0–400.0)
RBC: 5.01 Mil/uL (ref 3.87–5.11)
RDW: 15.3 % (ref 11.5–15.5)
WBC: 5.3 10*3/uL (ref 4.0–10.5)

## 2013-10-07 LAB — URINALYSIS, ROUTINE W REFLEX MICROSCOPIC
BILIRUBIN URINE: NEGATIVE
Hgb urine dipstick: NEGATIVE
KETONES UR: NEGATIVE
LEUKOCYTES UA: NEGATIVE
Nitrite: NEGATIVE
SPECIFIC GRAVITY, URINE: 1.01 (ref 1.000–1.030)
Total Protein, Urine: NEGATIVE
URINE GLUCOSE: NEGATIVE
UROBILINOGEN UA: 0.2 (ref 0.0–1.0)
pH: 7 (ref 5.0–8.0)

## 2013-10-07 LAB — LIPID PANEL
CHOLESTEROL: 204 mg/dL — AB (ref 0–200)
HDL: 40 mg/dL (ref >39.00)
LDL Cholesterol: 149 mg/dL — ABNORMAL HIGH (ref 0–99)
NONHDL: 164
Total CHOL/HDL Ratio: 5
Triglycerides: 73 mg/dL (ref 0.0–149.0)
VLDL: 14.6 mg/dL (ref 0.0–40.0)

## 2013-10-07 LAB — TSH: TSH: 1.1 u[IU]/mL (ref 0.35–4.50)

## 2013-10-07 LAB — CARDIAC PANEL
CK MB: 0.6 ng/mL (ref 0.3–4.0)
CK TOTAL: 54 U/L (ref 7–177)
RELATIVE INDEX: 1.1 calc (ref 0.0–2.5)

## 2013-10-07 LAB — HEMOGLOBIN A1C: Hgb A1c MFr Bld: 5.9 % (ref 4.6–6.5)

## 2013-10-07 LAB — HCG, QUANTITATIVE, PREGNANCY: QUANTITATIVE HCG: 0.36 m[IU]/mL

## 2013-10-07 LAB — D-DIMER, QUANTITATIVE (NOT AT ARMC): D DIMER QUANT: 0.56 ug{FEU}/mL — AB (ref 0.00–0.48)

## 2013-10-07 LAB — AMYLASE: Amylase: 53 U/L (ref 27–131)

## 2013-10-07 LAB — LIPASE: LIPASE: 17 U/L (ref 11.0–59.0)

## 2013-10-07 LAB — TROPONIN I: Troponin I: <0.01 ng/mL (ref <0.06)

## 2013-10-07 MED ORDER — IOHEXOL 350 MG/ML SOLN
100.0000 mL | Freq: Once | INTRAVENOUS | Status: AC | PRN
  Administered 2013-10-07: 100 mL via INTRAVENOUS

## 2013-10-07 NOTE — Assessment & Plan Note (Signed)
Exam is normal  EKG shows sinus bradycardia but is other wise normal Her D-dimer is slightly elevated, I have ordered a CT angio to check for PE

## 2013-10-07 NOTE — Patient Instructions (Signed)

## 2013-10-07 NOTE — Assessment & Plan Note (Signed)
CT angio ordered to check for PE

## 2013-10-07 NOTE — Progress Notes (Signed)
Pre visit review using our clinic review tool, if applicable. No additional management support is needed unless otherwise documented below in the visit note. 

## 2013-10-07 NOTE — Assessment & Plan Note (Signed)
Exam is benign Labs are all normal Will follow this for now

## 2013-10-07 NOTE — Assessment & Plan Note (Signed)
Her K+ level is normal now 

## 2013-10-07 NOTE — Assessment & Plan Note (Signed)
She feels like the diuretic has caused fatigue, her BP is rather low today, will stop the diuretic Her EKG shows sinus bradycardia, may need to stop bystolic as well, will re-evaluate that later Will check her lytes and renal function today

## 2013-10-07 NOTE — Progress Notes (Signed)
Subjective:    Patient ID: Caroline Jones, female    DOB: 1980-11-12, 33 y.o.   MRN: [id number]  Chest Pain  This is a recurrent problem. The current episode started more than 1 month ago. The onset quality is gradual. The problem occurs intermittently. The problem has been unchanged. The pain is present in the substernal region. The pain is at a severity of 1/10. The pain is mild. The quality of the pain is described as sharp. The pain does not radiate. Associated symptoms include abdominal pain (aching in B lower quadrants) and malaise/fatigue. Pertinent negatives include no back pain, claudication, cough, diaphoresis, dizziness, exertional chest pressure, fever, headaches, hemoptysis, irregular heartbeat, leg pain, lower extremity edema, nausea, near-syncope, numbness, orthopnea, palpitations, PND, shortness of breath, sputum production, syncope, vomiting or weakness. The pain is aggravated by movement. She has tried nothing for the symptoms. The treatment provided no relief. Risk factors include sedentary lifestyle, obesity and lack of exercise.      Review of Systems  Constitutional: Positive for malaise/fatigue, fatigue and unexpected weight change (some weight gain). Negative for fever, chills, diaphoresis, activity change and appetite change.  HENT: Negative.   Eyes: Negative.   Respiratory: Negative.  Negative for apnea, cough, hemoptysis, sputum production, choking, chest tightness, shortness of breath, wheezing and stridor.   Cardiovascular: Positive for chest pain. Negative for palpitations, orthopnea, claudication, leg swelling, syncope, PND and near-syncope.  Gastrointestinal: Positive for abdominal pain (aching in B lower quadrants). Negative for nausea, vomiting, diarrhea, constipation, blood in stool, abdominal distention, anal bleeding and rectal pain.  Endocrine: Negative.   Genitourinary: Negative.  Negative for dysuria, urgency, hematuria, flank pain, difficulty urinating,  pelvic pain and dyspareunia.  Musculoskeletal: Negative.  Negative for back pain, gait problem, myalgias and neck pain.  Skin: Negative.  Negative for rash.  Allergic/Immunologic: Negative.   Neurological: Negative.  Negative for dizziness, weakness, numbness and headaches.  Hematological: Negative.  Negative for adenopathy. Does not bruise/bleed easily.  Psychiatric/Behavioral: Negative.        Objective:   Physical Exam  Vitals reviewed. Constitutional: She is oriented to person, place, and time. She appears well-developed and well-nourished.  Non-toxic appearance. She does not have a sickly appearance. She does not appear ill. No distress.  HENT:  Head: Normocephalic and atraumatic.  Mouth/Throat: Oropharynx is clear and moist. No oropharyngeal exudate.  Eyes: Conjunctivae are normal. Right eye exhibits no discharge. Left eye exhibits no discharge. No scleral icterus.  Neck: Normal range of motion. Neck supple. No JVD present. No tracheal deviation present. No thyromegaly present.  Cardiovascular: Normal rate, regular rhythm, normal heart sounds and intact distal pulses.  Exam reveals no gallop and no friction rub.   No murmur heard. Pulmonary/Chest: Effort normal and breath sounds normal. No accessory muscle usage or stridor. Not tachypneic. No respiratory distress. She has no decreased breath sounds. She has no wheezes. She has no rhonchi. She has no rales. Chest wall is not dull to percussion. She exhibits no mass, no tenderness, no bony tenderness, no laceration, no crepitus, no edema, no deformity, no swelling and no retraction.  Abdominal: Soft. Bowel sounds are normal. She exhibits no distension and no mass. There is no tenderness. There is no rebound and no guarding.  Musculoskeletal: Normal range of motion. She exhibits no edema and no tenderness.  Lymphadenopathy:    She has no cervical adenopathy.  Neurological: She is oriented to person, place, and time.  Skin: Skin is warm  and dry. No  rash noted. She is not diaphoretic. No erythema. No pallor.  Psychiatric: She has a normal mood and affect. Her behavior is normal. Judgment and thought content normal.     Lab Results  Component Value Date   WBC 7.4 06/15/2012   HGB 12.9 06/15/2012   HCT 38.9 06/15/2012   PLT 352.0 06/15/2012   GLUCOSE 89 06/14/2013   CHOL 187 11/19/2012   TRIG 72.0 11/19/2012   HDL 40.60 11/19/2012   LDLCALC 132* 11/19/2012   ALT 14 06/15/2012   AST 14 06/15/2012   NA 140 06/14/2013   K 3.5 06/14/2013   CL 104 06/14/2013   CREATININE 0.6 06/14/2013   BUN 12 06/14/2013   CO2 29 06/14/2013   TSH 1.39 11/19/2012   HGBA1C 6.0 11/19/2012        Assessment & Plan:

## 2013-10-08 ENCOUNTER — Encounter: Payer: Self-pay | Admitting: Internal Medicine

## 2013-10-14 ENCOUNTER — Telehealth: Payer: Self-pay | Admitting: Internal Medicine

## 2013-10-14 NOTE — Telephone Encounter (Signed)
Pt notified and will move forward with option 1 and 2, she request to hold off on restart of diuretic.

## 2013-10-14 NOTE — Telephone Encounter (Signed)
Patient states Dr. Yetta Barre dropped dieretic and she is still swelling.  She states it hurts to put her shoes on.  She would like to know what to do before the weekend comes.

## 2013-10-14 NOTE — Telephone Encounter (Signed)
Options - 1. Decrease salt intake, elevate legs, lose weight 2. Restart diuretic 3. Order some compression stockings  What is her choice?

## 2013-11-02 ENCOUNTER — Telehealth: Payer: Self-pay | Admitting: *Deleted

## 2013-11-02 NOTE — Telephone Encounter (Signed)
Left msg on triage stating wanting to let md know that she started back taking the diuretic because the fluid is not going anywhere...Caroline Jones/lmb

## 2013-11-15 ENCOUNTER — Encounter: Payer: Self-pay | Admitting: Internal Medicine

## 2013-11-15 ENCOUNTER — Ambulatory Visit (INDEPENDENT_AMBULATORY_CARE_PROVIDER_SITE_OTHER): Payer: 59 | Admitting: Internal Medicine

## 2013-11-15 VITALS — BP 112/78 | HR 63 | Temp 98.1°F | Resp 16 | Ht 65.0 in | Wt 235.0 lb

## 2013-11-15 DIAGNOSIS — K219 Gastro-esophageal reflux disease without esophagitis: Secondary | ICD-10-CM

## 2013-11-15 DIAGNOSIS — I10 Essential (primary) hypertension: Secondary | ICD-10-CM

## 2013-11-15 MED ORDER — DEXLANSOPRAZOLE 60 MG PO CPDR: 60.0000 mg | DELAYED_RELEASE_CAPSULE | Freq: Every day | ORAL | Status: DC

## 2013-11-15 NOTE — Patient Instructions (Signed)
Gastroesophageal Reflux Disease, Adult Gastroesophageal reflux disease (GERD) happens when acid from your stomach flows up into the esophagus. When acid comes in contact with the esophagus, the acid causes soreness (inflammation) in the esophagus. Over time, GERD may create small holes (ulcers) in the lining of the esophagus. CAUSES   Increased body weight. This puts pressure on the stomach, making acid rise from the stomach into the esophagus.  Smoking. This increases acid production in the stomach.  Drinking alcohol. This causes decreased pressure in the lower esophageal sphincter (valve or ring of muscle between the esophagus and stomach), allowing acid from the stomach into the esophagus.  Late evening meals and a full stomach. This increases pressure and acid production in the stomach.  A malformed lower esophageal sphincter. Sometimes, no cause is found. SYMPTOMS   Burning pain in the lower part of the mid-chest behind the breastbone and in the mid-stomach area. This may occur twice a week or more often.  Trouble swallowing.  Sore throat.  Dry cough.  Asthma-like symptoms including chest tightness, shortness of breath, or wheezing. DIAGNOSIS  Your caregiver may be able to diagnose GERD based on your symptoms. In some cases, X-rays and other tests may be done to check for complications or to check the condition of your stomach and esophagus. TREATMENT  Your caregiver may recommend over-the-counter or prescription medicines to help decrease acid production. Ask your caregiver before starting or adding any new medicines.  HOME CARE INSTRUCTIONS   Change the factors that you can control. Ask your caregiver for guidance concerning weight loss, quitting smoking, and alcohol consumption.  Avoid foods and drinks that make your symptoms worse, such as:  Caffeine or alcoholic drinks.  Chocolate.  Peppermint or mint flavorings.  Garlic and onions.  Spicy foods.  Citrus fruits,  such as oranges, lemons, or limes.  Tomato-based foods such as sauce, chili, salsa, and pizza.  Fried and fatty foods.  Avoid lying down for the 3 hours prior to your bedtime or prior to taking a nap.  Eat small, frequent meals instead of large meals.  Wear loose-fitting clothing. Do not wear anything tight around your waist that causes pressure on your stomach.  Raise the head of your bed 6 to 8 inches with wood blocks to help you sleep. Extra pillows will not help.  Only take over-the-counter or prescription medicines for pain, discomfort, or fever as directed by your caregiver.  Do not take aspirin, ibuprofen, or other nonsteroidal anti-inflammatory drugs (NSAIDs). SEEK IMMEDIATE MEDICAL CARE IF:   You have pain in your arms, neck, jaw, teeth, or back.  Your pain increases or changes in intensity or duration.  You develop nausea, vomiting, or sweating (diaphoresis).  You develop shortness of breath, or you faint.  Your vomit is green, yellow, black, or looks like coffee grounds or blood.  Your stool is red, bloody, or black. These symptoms could be signs of other problems, such as heart disease, gastric bleeding, or esophageal bleeding. MAKE SURE YOU:   Understand these instructions.  Will watch your condition.  Will get help right away if you are not doing well or get worse. Document Released: 10/23/2004 Document Revised: 04/07/2011 Document Reviewed: 08/02/2010 [employer] Patient Information 2015 [employer]. This information is not intended to replace advice given to you by your health care provider. Make sure you discuss any questions you have with your health care provider.  

## 2013-11-15 NOTE — Assessment & Plan Note (Signed)
Start dexilant 

## 2013-11-15 NOTE — Assessment & Plan Note (Signed)
Her BP is well controlled 

## 2013-11-15 NOTE — Progress Notes (Signed)
Pre visit review using our clinic review tool, if applicable. No additional management support is needed unless otherwise documented below in the visit note. 

## 2013-11-15 NOTE — Progress Notes (Signed)
Subjective:    Patient ID: Caroline Jones, female    DOB: [birth year], 33 y.o.   MRN: [id number]  Gastrophageal Reflux She reports no abdominal pain, no belching, no chest pain, no choking, no coughing, no dysphagia, no early satiety, no globus sensation, no heartburn, no hoarse voice, no nausea, no sore throat, no stridor, no tooth decay, no water brash or no wheezing. This is a recurrent problem. The current episode started more than 1 year ago. The problem has been gradually worsening. Nothing aggravates the symptoms. Pertinent negatives include no anemia, fatigue, melena, muscle weakness, orthopnea or weight loss. Risk factors include obesity. She has tried an antacid for the symptoms. The treatment provided mild relief.      Review of Systems  Constitutional: Positive for unexpected weight change (wt gain). Negative for fever, chills, weight loss, diaphoresis, appetite change and fatigue.  HENT: Negative.  Negative for hoarse voice, sore throat, trouble swallowing and voice change.   Eyes: Negative.   Respiratory: Negative.  Negative for cough, choking, chest tightness, shortness of breath, wheezing and stridor.   Cardiovascular: Negative.  Negative for chest pain, palpitations and leg swelling.  Gastrointestinal: Negative.  Negative for heartburn, dysphagia, nausea, vomiting, abdominal pain, diarrhea, constipation, blood in stool, melena, abdominal distention, anal bleeding and rectal pain.  Endocrine: Negative.   Genitourinary: Negative.   Musculoskeletal: Negative.  Negative for back pain, muscle weakness, myalgias and neck pain.  Skin: Negative.  Negative for rash.  Allergic/Immunologic: Negative.   Neurological: Negative.  Negative for dizziness, tremors, syncope, light-headedness and numbness.  Hematological: Negative.  Negative for adenopathy. Does not bruise/bleed easily.  Psychiatric/Behavioral: Negative.        Objective:   Physical Exam  Vitals  reviewed. Constitutional: She is oriented to person, place, and time. She appears well-developed and well-nourished.  HENT:  Head: Normocephalic and atraumatic.  Mouth/Throat: No oropharyngeal exudate.  Eyes: Conjunctivae are normal. Right eye exhibits no discharge. Left eye exhibits no discharge. No scleral icterus.  Neck: Normal range of motion. Neck supple. No JVD present. No tracheal deviation present. No thyromegaly present.  Cardiovascular: Normal rate, regular rhythm, normal heart sounds and intact distal pulses.  Exam reveals no gallop and no friction rub.   No murmur heard. Pulmonary/Chest: Effort normal and breath sounds normal. No stridor. No respiratory distress. She has no wheezes. She has no rales. She exhibits no tenderness.  Abdominal: Soft. Bowel sounds are normal. She exhibits no distension and no mass. There is no tenderness. There is no rebound and no guarding.  Musculoskeletal: Normal range of motion. She exhibits no edema and no tenderness.  Lymphadenopathy:    She has no cervical adenopathy.  Neurological: She is oriented to person, place, and time.  Skin: Skin is warm and dry. No rash noted. She is not diaphoretic. No erythema. No pallor.  Psychiatric: She has a normal mood and affect. Her behavior is normal. Judgment and thought content normal.     Lab Results  Component Value Date   WBC 5.3 10/07/2013   HGB 12.8 10/07/2013   HCT 39.8 10/07/2013   PLT 343.0 10/07/2013   GLUCOSE 82 10/07/2013   CHOL 204* 10/07/2013   TRIG 73.0 10/07/2013   HDL 40.00 10/07/2013   LDLCALC 149* 10/07/2013   ALT 13 10/07/2013   AST 12 10/07/2013   NA 138 10/07/2013   K 3.8 10/07/2013   CL 100 10/07/2013   CREATININE 0.6 10/07/2013   BUN 9 10/07/2013   CO2 31  10/07/2013   TSH 1.10 10/07/2013   HGBA1C 5.9 10/07/2013       Assessment & Plan:

## 2014-01-23 ENCOUNTER — Encounter: Payer: Self-pay | Admitting: *Deleted

## 2014-01-25 ENCOUNTER — Other Ambulatory Visit: Payer: Self-pay | Admitting: Internal Medicine

## 2014-01-26 ENCOUNTER — Other Ambulatory Visit: Payer: Self-pay | Admitting: Internal Medicine

## 2014-03-21 ENCOUNTER — Ambulatory Visit: Payer: Self-pay | Admitting: Internal Medicine

## 2014-03-21 ENCOUNTER — Ambulatory Visit: Payer: 59 | Admitting: Internal Medicine

## 2014-04-04 ENCOUNTER — Ambulatory Visit: Payer: Self-pay | Admitting: Internal Medicine

## 2014-05-23 ENCOUNTER — Telehealth: Payer: Self-pay | Admitting: Internal Medicine

## 2014-05-23 DIAGNOSIS — K219 Gastro-esophageal reflux disease without esophagitis: Secondary | ICD-10-CM

## 2014-05-23 MED ORDER — DEXLANSOPRAZOLE 60 MG PO CPDR: 60.0000 mg | DELAYED_RELEASE_CAPSULE | Freq: Every day | ORAL | Status: DC

## 2014-05-23 NOTE — Telephone Encounter (Signed)
Rx has been [facility] to [facility] 

## 2014-05-23 NOTE — Telephone Encounter (Signed)
Husband called in and said that they sample of the Dexilant worked really well for her and wanted to know if Dr Yetta BarreJones could call in a script for her.   [facility] [address]

## 2014-07-19 ENCOUNTER — Telehealth: Payer: Self-pay | Admitting: Internal Medicine

## 2014-07-19 DIAGNOSIS — I10 Essential (primary) hypertension: Secondary | ICD-10-CM

## 2014-07-19 NOTE — Telephone Encounter (Signed)
Pt's Spouse, Ortencia Kick, called and requested a refill on Bystolic 10 mg to be sent to [facility] [address]

## 2014-07-20 MED ORDER — NEBIVOLOL HCL 10 MG PO TABS: 10.0000 mg | ORAL_TABLET | Freq: Every day | ORAL | Status: DC

## 2014-07-20 NOTE — Telephone Encounter (Signed)
Rx approved and sent to pharmacy, see below info.  nebivolol (BYSTOLIC) 10 MG tablet 30 tablet 11 07/20/2014       Sig - Route: Take 1 tablet (10 mg total) by mouth daily. - Oral     E-Prescribing Status: Receipt confirmed by pharmacy (07/20/2014 7:46 AM EDT)

## 2014-11-06 ENCOUNTER — Ambulatory Visit: Payer: Self-pay | Admitting: Internal Medicine

## 2014-11-23 ENCOUNTER — Encounter: Payer: Self-pay | Admitting: Internal Medicine

## 2014-11-23 ENCOUNTER — Other Ambulatory Visit (HOSPITAL_COMMUNITY)
Admission: RE | Admit: 2014-11-23 | Discharge: 2014-11-23 | Disposition: A | Discharge status: Home / Self Care | Payer: 59 | Source: Ambulatory Visit | Attending: Internal Medicine | Admitting: Internal Medicine

## 2014-11-23 ENCOUNTER — Other Ambulatory Visit (INDEPENDENT_AMBULATORY_CARE_PROVIDER_SITE_OTHER): Payer: 59

## 2014-11-23 ENCOUNTER — Ambulatory Visit (INDEPENDENT_AMBULATORY_CARE_PROVIDER_SITE_OTHER): Payer: 59 | Admitting: Internal Medicine

## 2014-11-23 VITALS — BP 132/88 | HR 55 | Temp 98.1°F | Resp 16 | Ht 65.0 in | Wt 244.0 lb

## 2014-11-23 DIAGNOSIS — I10 Essential (primary) hypertension: Secondary | ICD-10-CM | POA: Diagnosis not present

## 2014-11-23 DIAGNOSIS — Z0000 Encounter for general adult medical examination without abnormal findings: Secondary | ICD-10-CM

## 2014-11-23 DIAGNOSIS — E669 Obesity, unspecified: Secondary | ICD-10-CM

## 2014-11-23 DIAGNOSIS — Z01419 Encounter for gynecological examination (general) (routine) without abnormal findings: Secondary | ICD-10-CM | POA: Insufficient documentation

## 2014-11-23 DIAGNOSIS — R739 Hyperglycemia, unspecified: Secondary | ICD-10-CM

## 2014-11-23 DIAGNOSIS — E876 Hypokalemia: Secondary | ICD-10-CM | POA: Diagnosis not present

## 2014-11-23 LAB — COMPREHENSIVE METABOLIC PANEL
ALT: 11 U/L (ref 0–35)
AST: 12 U/L (ref 0–37)
Albumin: 3.8 g/dL (ref 3.5–5.2)
Alkaline Phosphatase: 88 U/L (ref 39–117)
BILIRUBIN TOTAL: 0.4 mg/dL (ref 0.2–1.2)
BUN: 11 mg/dL (ref 6–23)
CALCIUM: 9.3 mg/dL (ref 8.4–10.5)
CHLORIDE: 105 meq/L (ref 96–112)
CO2: 28 meq/L (ref 19–32)
CREATININE: 0.51 mg/dL (ref 0.40–1.20)
GFR: 175.13 mL/min (ref >60.00)
GLUCOSE: 91 mg/dL (ref 70–99)
Potassium: 3.6 mEq/L (ref 3.5–5.1)
SODIUM: 141 meq/L (ref 135–145)
Total Protein: 7.9 g/dL (ref 6.0–8.3)

## 2014-11-23 LAB — CBC WITH DIFFERENTIAL/PLATELET
BASOS PCT: 0.5 % (ref 0.0–3.0)
Basophils Absolute: 0 10*3/uL (ref 0.0–0.1)
EOS ABS: 0.1 10*3/uL (ref 0.0–0.7)
Eosinophils Relative: 1 % (ref 0.0–5.0)
HEMATOCRIT: 38 % (ref 36.0–46.0)
Hemoglobin: 12 g/dL (ref 12.0–15.0)
LYMPHS ABS: 1.6 10*3/uL (ref 0.7–4.0)
LYMPHS PCT: 23.3 % (ref 12.0–46.0)
MCHC: 31.5 g/dL (ref 30.0–36.0)
MCV: 72.8 fl — ABNORMAL LOW (ref 78.0–100.0)
Monocytes Absolute: 0.4 10*3/uL (ref 0.1–1.0)
Monocytes Relative: 6.1 % (ref 3.0–12.0)
NEUTROS ABS: 4.9 10*3/uL (ref 1.4–7.7)
NEUTROS PCT: 69.1 % (ref 43.0–77.0)
PLATELETS: 328 10*3/uL (ref 150.0–400.0)
RBC: 5.22 Mil/uL — ABNORMAL HIGH (ref 3.87–5.11)
RDW: 18.1 % — AB (ref 11.5–15.5)
WBC: 7.1 10*3/uL (ref 4.0–10.5)

## 2014-11-23 LAB — TSH: TSH: 2.05 u[IU]/mL (ref 0.35–4.50)

## 2014-11-23 LAB — LIPID PANEL
CHOLESTEROL: 189 mg/dL (ref 0–200)
HDL: 39.1 mg/dL (ref >39.00)
LDL Cholesterol: 134 mg/dL — ABNORMAL HIGH (ref 0–99)
NONHDL: 150
Total CHOL/HDL Ratio: 5
Triglycerides: 82 mg/dL (ref 0.0–149.0)
VLDL: 16.4 mg/dL (ref 0.0–40.0)

## 2014-11-23 LAB — HEMOGLOBIN A1C: HEMOGLOBIN A1C: 5.7 % (ref 4.6–6.5)

## 2014-11-23 NOTE — Progress Notes (Signed)
Pre visit review using our clinic review tool, if applicable. No additional management support is needed unless otherwise documented below in the visit note. 

## 2014-11-23 NOTE — Progress Notes (Signed)
Subjective:  Patient ID: Caroline Jones, female    DOB: [birth year]  Age: 34 y.o. MRN: [id number]  CC: Hypertension and Annual Exam   HPI Caroline Jones presents for a CPX and BP check - she complains of persistent weight gain and that her feet get swollen when she stands for long periods of time.  Outpatient Prescriptions Prior to Visit  Medication Sig Dispense Refill  . dexlansoprazole (DEXILANT) 60 MG capsule Take 1 capsule (60 mg total) by mouth daily. 90 capsule 3  . nebivolol (BYSTOLIC) 10 MG tablet Take 1 tablet (10 mg total) by mouth daily. 30 tablet 11  . triamterene-hydrochlorothiazide (DYAZIDE) 50-25 MG per capsule TAKE 1 CAPSULE BY MOUTH EVERY MORNING. 30 capsule 5  . triamterene-hydrochlorothiazide (DYAZIDE) 50-25 MG per capsule TAKE 1 CAPSULE BY MOUTH EVERY MORNING. 30 capsule 5   No facility-administered medications prior to visit.    ROS Review of Systems  Constitutional: Positive for unexpected weight change. Negative for fever, chills, diaphoresis and fatigue.  HENT: Negative.  Negative for congestion, sinus pressure and trouble swallowing.   Eyes: Negative.  Negative for visual disturbance.  Respiratory: Negative.  Negative for apnea, cough, choking, chest tightness, shortness of breath, wheezing and stridor.   Cardiovascular: Negative.  Negative for chest pain, palpitations and leg swelling.  Gastrointestinal: Negative.  Negative for nausea, abdominal pain, diarrhea, constipation and blood in stool.  Endocrine: Negative.   Genitourinary: Negative.  Negative for dysuria, decreased urine volume, vaginal bleeding and difficulty urinating.  Musculoskeletal: Negative.  Negative for myalgias, back pain and joint swelling.  Skin: Negative.  Negative for color change and rash.  Allergic/Immunologic: Negative.   Neurological: Negative.  Negative for dizziness, tremors, syncope, light-headedness, numbness and headaches.  Hematological: Negative.  Negative for  adenopathy. Does not bruise/bleed easily.  Psychiatric/Behavioral: Negative.     Objective:  BP 132/88 mmHg  Pulse 55  Temp(Src) 98.1 F (36.7 C) (Oral)  Resp 16  Ht  (1.651 m)  Wt 244 lb (110.678 kg)  BMI 40.60 kg/m2  SpO2 98%  LMP 11/03/2014  BP Readings from Last 3 Encounters:  11/23/14 132/88  11/15/13 112/78  10/07/13 116/82    Wt Readings from Last 3 Encounters:  11/23/14 244 lb (110.678 kg)  11/15/13 235 lb (106.595 kg)  10/07/13 230 lb 2 oz (104.384 kg)    Physical Exam  Constitutional: She is oriented to person, place, and time. She appears well-developed and well-nourished.  Non-toxic appearance. She does not have a sickly appearance. She does not appear ill. No distress.  HENT:  Head: Normocephalic and atraumatic.  Mouth/Throat: Oropharynx is clear and moist. No oropharyngeal exudate.  Eyes: Conjunctivae and EOM are normal. Pupils are equal, round, and reactive to light. Right eye exhibits no discharge. Left eye exhibits no discharge. No scleral icterus.  Neck: Normal range of motion. Neck supple. No JVD present. No tracheal deviation present. No thyromegaly present.  Cardiovascular: Normal rate, regular rhythm, normal heart sounds and intact distal pulses.  Exam reveals no gallop and no friction rub.   No murmur heard. Pulmonary/Chest: Effort normal and breath sounds normal. No stridor. No respiratory distress. She has no wheezes. She has no rales. She exhibits no tenderness.  Abdominal: Soft. Bowel sounds are normal. She exhibits no distension and no mass. There is no tenderness. There is no rebound and no guarding. Hernia confirmed negative in the right inguinal area and confirmed negative in the left inguinal area.  Genitourinary: Rectum normal, vagina normal  and uterus normal. Rectal exam shows no external hemorrhoid, no internal hemorrhoid, no fissure, no mass, no tenderness and anal tone normal. Guaiac negative stool. No breast swelling, tenderness,  discharge or bleeding. Pelvic exam was performed with patient supine. No labial fusion. There is no rash, tenderness, lesion or injury on the right labia. There is no rash, tenderness, lesion or injury on the left labia. Uterus is not deviated, not enlarged, not fixed and not tender. Cervix exhibits no motion tenderness, no discharge and no friability. Right adnexum displays no mass, no tenderness and no fullness. Left adnexum displays no mass and no fullness. No erythema, tenderness or bleeding in the vagina. No foreign body around the vagina. No signs of injury around the vagina. No vaginal discharge found.  Musculoskeletal: Normal range of motion. She exhibits no edema or tenderness.  Lymphadenopathy:    She has no cervical adenopathy.       Right: No inguinal adenopathy present.       Left: No inguinal adenopathy present.  Neurological: She is oriented to person, place, and time.  Skin: Skin is warm and dry. No rash noted. She is not diaphoretic. No erythema. No pallor.  Psychiatric: She has a normal mood and affect. Her behavior is normal. Judgment and thought content normal.  Vitals reviewed.   Lab Results  Component Value Date   WBC 7.1 11/23/2014   HGB 12.0 11/23/2014   HCT 38.0 11/23/2014   PLT 328.0 11/23/2014   GLUCOSE 91 11/23/2014   CHOL 189 11/23/2014   TRIG 82.0 11/23/2014   HDL 39.10 11/23/2014   LDLCALC 134* 11/23/2014   ALT 11 11/23/2014   AST 12 11/23/2014   NA 141 11/23/2014   K 3.6 11/23/2014   CL 105 11/23/2014   CREATININE 0.51 11/23/2014   BUN 11 11/23/2014   CO2 28 11/23/2014   TSH 2.05 11/23/2014   HGBA1C 5.7 11/23/2014    Ct Angio Chest Pe W/cm &/or Wo Cm  10/07/2013  CLINICAL DATA:  Chest pain. EXAM: CT ANGIOGRAPHY CHEST WITH CONTRAST TECHNIQUE: Multidetector CT imaging of the chest was performed using the standard protocol during bolus administration of intravenous contrast. Multiplanar CT image reconstructions and MIPs were obtained to evaluate the  vascular anatomy. CONTRAST:  OMNIPAQUE IOHEXOL 350 MG/ML SOLN COMPARISON:  None. FINDINGS: Chest wall: No significant findings. Mediastinum: The heart is normal in size. No pericardial effusion. Minimal residual thymic tissue noted in the anterior mediastinum. No mediastinal or hilar mass or adenopathy. The esophagus is grossly normal. There is a small hiatal hernia. Thoracic aorta is normal in caliber. No dissection. The pulmonary arterial tree is well opacified. No filling defects to suggest pulmonary emboli. The lungs are clear. Upper abdomen: No significant findings. Review of the MIP images confirms the above findings. IMPRESSION: No CT findings for pulmonary embolism. Normal thoracic aorta. No acute pulmonary findings. Electronically Signed   By: Loralie Champagne M.D.   On: 10/07/2013 18:54    Assessment & Plan:   Sanari was seen today for hypertension and annual exam.  Diagnoses and all orders for this visit:  Routine general medical examination at a health care facility- exam done, PAP collected and sent, vaccines were reviewed and updated, labs reviewed, pt ed material was given -     Lipid panel; Future -     TSH; Future -     Hemoglobin A1c; Future -     Comprehensive metabolic panel; Future -     CBC with Differential/Platelet;  Future -     Cytology - PAP; Future  Obesity (BMI 30-39.9)- I have asked her to consider bariatric surgery -     Ambulatory referral to General Surgery  Essential hypertension, benign- her BP is well controlled  Hyperglycemia- she has prediabetes and agrees to work on her lifestyle modifications  Hypokalemia- her level is 3.6, will cont to monitor this    I am having Ms. Pollie MeyerMcIntyre maintain her triamterene-hydrochlorothiazide, dexlansoprazole, and nebivolol.  No orders of the defined types were placed in this encounter.     Follow-up: Return in about 6 months (around 05/24/2015).  Sanda Lingerhomas Jousha Schwandt, MD

## 2014-11-23 NOTE — Patient Instructions (Signed)
Preventive Care for Adults, Female A healthy lifestyle and preventive care can promote health and wellness. Preventive health guidelines for women include the following key practices.  A routine yearly physical is a good way to check with your health care provider about your health and preventive screening. It is a chance to share any concerns and updates on your health and to receive a thorough exam.  Visit your dentist for a routine exam and preventive care every 6 months. Brush your teeth twice a day and floss once a day. Good oral hygiene prevents tooth decay and gum disease.  The frequency of eye exams is based on your age, health, family medical history, use of contact lenses, and other factors. Follow your health care provider's recommendations for frequency of eye exams.  Eat a healthy diet. Foods like vegetables, fruits, whole grains, low-fat dairy products, and lean protein foods contain the nutrients you need without too many calories. Decrease your intake of foods high in solid fats, added sugars, and salt. Eat the right amount of calories for you.Get information about a proper diet from your health care provider, if necessary.  Regular physical exercise is one of the most important things you can do for your health. Most adults should get at least 150 minutes of moderate-intensity exercise (any activity that increases your heart rate and causes you to sweat) each week. In addition, most adults need muscle-strengthening exercises on 2 or more days a week.  Maintain a healthy weight. The body mass index (BMI) is a screening tool to identify possible weight problems. It provides an estimate of body fat based on height and weight. Your health care provider can find your BMI and can help you achieve or maintain a healthy weight.For adults 20 years and older:  A BMI below 18.5 is considered underweight.  A BMI of 18.5 to 24.9 is normal.  A BMI of 25 to 29.9 is considered overweight.  A  BMI of 30 and above is considered obese.  Maintain normal blood lipids and cholesterol levels by exercising and minimizing your intake of saturated fat. Eat a balanced diet with plenty of fruit and vegetables. Blood tests for lipids and cholesterol should begin at age 45 and be repeated every 5 years. If your lipid or cholesterol levels are high, you are over 50, or you are at high risk for heart disease, you may need your cholesterol levels checked more frequently.Ongoing high lipid and cholesterol levels should be treated with medicines if diet and exercise are not working.  If you smoke, find out from your health care provider how to quit. If you do not use tobacco, do not start.  Lung cancer screening is recommended for adults aged 45-80 years who are at high risk for developing lung cancer because of a history of smoking. A yearly low-dose CT scan of the lungs is recommended for people who have at least a 30-pack-year history of smoking and are a current smoker or have quit within the past 15 years. A pack year of smoking is smoking an average of 1 pack of cigarettes a day for 1 year (for example: 1 pack a day for 30 years or 2 packs a day for 15 years). Yearly screening should continue until the smoker has stopped smoking for at least 15 years. Yearly screening should be stopped for people who develop a health problem that would prevent them from having lung cancer treatment.  If you are pregnant, do not drink alcohol. If you are  breastfeeding, be very cautious about drinking alcohol. If you are not pregnant and choose to drink alcohol, do not have more than 1 drink per day. One drink is considered to be 12 ounces (355 mL) of beer, 5 ounces (148 mL) of wine, or 1.5 ounces (44 mL) of liquor.  Avoid use of street drugs. Do not share needles with anyone. Ask for help if you need support or instructions about stopping the use of drugs.  High blood pressure causes heart disease and increases the risk  of stroke. Your blood pressure should be checked at least every 1 to 2 years. Ongoing high blood pressure should be treated with medicines if weight loss and exercise do not work.  If you are 55-79 years old, ask your health care provider if you should take aspirin to prevent strokes.  Diabetes screening is done by taking a blood sample to check your blood glucose level after you have not eaten for a certain period of time (fasting). If you are not overweight and you do not have risk factors for diabetes, you should be screened once every 3 years starting at age 45. If you are overweight or obese and you are 40-70 years of age, you should be screened for diabetes every year as part of your cardiovascular risk assessment.  Breast cancer screening is essential preventive care for women. You should practice "breast self-awareness." This means understanding the normal appearance and feel of your breasts and may include breast self-examination. Any changes detected, no matter how small, should be reported to a health care provider. Women in their 20s and 30s should have a clinical breast exam (CBE) by a health care provider as part of a regular health exam every 1 to 3 years. After age 40, women should have a CBE every year. Starting at age 40, women should consider having a mammogram (breast X-ray test) every year. Women who have a family history of breast cancer should talk to their health care provider about genetic screening. Women at a high risk of breast cancer should talk to their health care providers about having an MRI and a mammogram every year.  Breast cancer gene (BRCA)-related cancer risk assessment is recommended for women who have family members with BRCA-related cancers. BRCA-related cancers include breast, ovarian, tubal, and peritoneal cancers. Having family members with these cancers may be associated with an increased risk for harmful changes (mutations) in the breast cancer genes BRCA1 and  BRCA2. Results of the assessment will determine the need for genetic counseling and BRCA1 and BRCA2 testing.  Your health care provider may recommend that you be screened regularly for cancer of the pelvic organs (ovaries, uterus, and vagina). This screening involves a pelvic examination, including checking for microscopic changes to the surface of your cervix (Pap test). You may be encouraged to have this screening done every 3 years, beginning at age 21.  For women ages 30-65, health care providers may recommend pelvic exams and Pap testing every 3 years, or they may recommend the Pap and pelvic exam, combined with testing for human papilloma virus (HPV), every 5 years. Some types of HPV increase your risk of cervical cancer. Testing for HPV may also be done on women of any age with unclear Pap test results.  Other health care providers may not recommend any screening for nonpregnant women who are considered low risk for pelvic cancer and who do not have symptoms. Ask your health care provider if a screening pelvic exam is right for   you.  If you have had past treatment for cervical cancer or a condition that could lead to cancer, you need Pap tests and screening for cancer for at least 20 years after your treatment. If Pap tests have been discontinued, your risk factors (such as having a new sexual partner) need to be reassessed to determine if screening should resume. Some women have medical problems that increase the chance of getting cervical cancer. In these cases, your health care provider may recommend more frequent screening and Pap tests.  Colorectal cancer can be detected and often prevented. Most routine colorectal cancer screening begins at the age of 50 years and continues through age 75 years. However, your health care provider may recommend screening at an earlier age if you have risk factors for colon cancer. On a yearly basis, your health care provider may provide home test kits to check  for hidden blood in the stool. Use of a small camera at the end of a tube, to directly examine the colon (sigmoidoscopy or colonoscopy), can detect the earliest forms of colorectal cancer. Talk to your health care provider about this at age 50, when routine screening begins. Direct exam of the colon should be repeated every 5-10 years through age 75 years, unless early forms of precancerous polyps or small growths are found.  People who are at an increased risk for hepatitis B should be screened for this virus. You are considered at high risk for hepatitis B if:  You were born in a country where hepatitis B occurs often. Talk with your health care provider about which countries are considered high risk.  Your parents were born in a high-risk country and you have not received a shot to protect against hepatitis B (hepatitis B vaccine).  You have HIV or AIDS.  You use needles to inject street drugs.  You live with, or have sex with, someone who has hepatitis B.  You get hemodialysis treatment.  You take certain medicines for conditions like cancer, organ transplantation, and autoimmune conditions.  Hepatitis C blood testing is recommended for all people born from 1945 through 1965 and any individual with known risks for hepatitis C.  Practice safe sex. Use condoms and avoid high-risk sexual practices to reduce the spread of sexually transmitted infections (STIs). STIs include gonorrhea, chlamydia, syphilis, trichomonas, herpes, HPV, and human immunodeficiency virus (HIV). Herpes, HIV, and HPV are viral illnesses that have no cure. They can result in disability, cancer, and death.  You should be screened for sexually transmitted illnesses (STIs) including gonorrhea and chlamydia if:  You are sexually active and are younger than 24 years.  You are older than 24 years and your health care provider tells you that you are at risk for this type of infection.  Your sexual activity has changed  since you were last screened and you are at an increased risk for chlamydia or gonorrhea. Ask your health care provider if you are at risk.  If you are at risk of being infected with HIV, it is recommended that you take a prescription medicine daily to prevent HIV infection. This is called preexposure prophylaxis (PrEP). You are considered at risk if:  You are sexually active and do not regularly use condoms or know the HIV status of your partner(s).  You take drugs by injection.  You are sexually active with a partner who has HIV.  Talk with your health care provider about whether you are at high risk of being infected with HIV. If   you choose to begin PrEP, you should first be tested for HIV. You should then be tested every 3 months for as long as you are taking PrEP.  Osteoporosis is a disease in which the bones lose minerals and strength with aging. This can result in serious bone fractures or breaks. The risk of osteoporosis can be identified using a bone density scan. Women ages 67 years and over and women at risk for fractures or osteoporosis should discuss screening with their health care providers. Ask your health care provider whether you should take a calcium supplement or vitamin D to reduce the rate of osteoporosis.  Menopause can be associated with physical symptoms and risks. Hormone replacement therapy is available to decrease symptoms and risks. You should talk to your health care provider about whether hormone replacement therapy is right for you.  Use sunscreen. Apply sunscreen liberally and repeatedly throughout the day. You should seek shade when your shadow is shorter than you. Protect yourself by wearing long sleeves, pants, a wide-brimmed hat, and sunglasses year round, whenever you are outdoors.  Once a month, do a whole body skin exam, using a mirror to look at the skin on your back. Tell your health care provider of new moles, moles that have irregular borders, moles that  are larger than a pencil eraser, or moles that have changed in shape or color.  Stay current with required vaccines (immunizations).  Influenza vaccine. All adults should be immunized every year.  Tetanus, diphtheria, and acellular pertussis (Td, Tdap) vaccine. Pregnant women should receive 1 dose of Tdap vaccine during each pregnancy. The dose should be obtained regardless of the length of time since the last dose. Immunization is preferred during the 27th-36th week of gestation. An adult who has not previously received Tdap or who does not know her vaccine status should receive 1 dose of Tdap. This initial dose should be followed by tetanus and diphtheria toxoids (Td) booster doses every 10 years. Adults with an unknown or incomplete history of completing a 3-dose immunization series with Td-containing vaccines should begin or complete a primary immunization series including a Tdap dose. Adults should receive a Td booster every 10 years.  Varicella vaccine. An adult without evidence of immunity to varicella should receive 2 doses or a second dose if she has previously received 1 dose. Pregnant females who do not have evidence of immunity should receive the first dose after pregnancy. This first dose should be obtained before leaving the health care facility. The second dose should be obtained 4-8 weeks after the first dose.  Human papillomavirus (HPV) vaccine. Females aged 13-26 years who have not received the vaccine previously should obtain the 3-dose series. The vaccine is not recommended for use in pregnant females. However, pregnancy testing is not needed before receiving a dose. If a female is found to be pregnant after receiving a dose, no treatment is needed. In that case, the remaining doses should be delayed until after the pregnancy. Immunization is recommended for any person with an immunocompromised condition through the age of 61 years if she did not get any or all doses earlier. During the  3-dose series, the second dose should be obtained 4-8 weeks after the first dose. The third dose should be obtained 24 weeks after the first dose and 16 weeks after the second dose.  Zoster vaccine. One dose is recommended for adults aged 30 years or older unless certain conditions are present.  Measles, mumps, and rubella (MMR) vaccine. Adults born  before 1957 generally are considered immune to measles and mumps. Adults born in 1957 or later should have 1 or more doses of MMR vaccine unless there is a contraindication to the vaccine or there is laboratory evidence of immunity to each of the three diseases. A routine second dose of MMR vaccine should be obtained at least 28 days after the first dose for students attending postsecondary schools, health care workers, or international travelers. People who received inactivated measles vaccine or an unknown type of measles vaccine during 1963-1967 should receive 2 doses of MMR vaccine. People who received inactivated mumps vaccine or an unknown type of mumps vaccine before 1979 and are at high risk for mumps infection should consider immunization with 2 doses of MMR vaccine. For females of childbearing age, rubella immunity should be determined. If there is no evidence of immunity, females who are not pregnant should be vaccinated. If there is no evidence of immunity, females who are pregnant should delay immunization until after pregnancy. Unvaccinated health care workers born before 1957 who lack laboratory evidence of measles, mumps, or rubella immunity or laboratory confirmation of disease should consider measles and mumps immunization with 2 doses of MMR vaccine or rubella immunization with 1 dose of MMR vaccine.  Pneumococcal 13-valent conjugate (PCV13) vaccine. When indicated, a person who is uncertain of his immunization history and has no record of immunization should receive the PCV13 vaccine. All adults 65 years of age and older should receive this  vaccine. An adult aged 19 years or older who has certain medical conditions and has not been previously immunized should receive 1 dose of PCV13 vaccine. This PCV13 should be followed with a dose of pneumococcal polysaccharide (PPSV23) vaccine. Adults who are at high risk for pneumococcal disease should obtain the PPSV23 vaccine at least 8 weeks after the dose of PCV13 vaccine. Adults older than 36 years of age who have normal immune system function should obtain the PPSV23 vaccine dose at least 1 year after the dose of PCV13 vaccine.  Pneumococcal polysaccharide (PPSV23) vaccine. When PCV13 is also indicated, PCV13 should be obtained first. All adults aged 65 years and older should be immunized. An adult younger than age 65 years who has certain medical conditions should be immunized. Any person who resides in a nursing home or long-term care facility should be immunized. An adult smoker should be immunized. People with an immunocompromised condition and certain other conditions should receive both PCV13 and PPSV23 vaccines. People with human immunodeficiency virus (HIV) infection should be immunized as soon as possible after diagnosis. Immunization during chemotherapy or radiation therapy should be avoided. Routine use of PPSV23 vaccine is not recommended for American Indians, [place] Natives, or people younger than 65 years unless there are medical conditions that require PPSV23 vaccine. When indicated, people who have unknown immunization and have no record of immunization should receive PPSV23 vaccine. One-time revaccination 5 years after the first dose of PPSV23 is recommended for people aged 19-64 years who have chronic kidney failure, nephrotic syndrome, asplenia, or immunocompromised conditions. People who received 1-2 doses of PPSV23 before age 65 years should receive another dose of PPSV23 vaccine at age 65 years or later if at least 5 years have passed since the previous dose. Doses of PPSV23 are not  needed for people immunized with PPSV23 at or after age 65 years.  Meningococcal vaccine. Adults with asplenia or persistent complement component deficiencies should receive 2 doses of quadrivalent meningococcal conjugate (MenACWY-D) vaccine. The doses should be obtained   at least 2 months apart. Microbiologists working with certain meningococcal bacteria, [place] recruits, people at risk during an outbreak, and people who travel to or live in countries with a high rate of meningitis should be immunized. A first-year college student up through age 34 years who is living in a residence hall should receive a dose if she did not receive a dose on or after her 16th birthday. Adults who have certain high-risk conditions should receive one or more doses of vaccine.  Hepatitis A vaccine. Adults who wish to be protected from this disease, have certain high-risk conditions, work with hepatitis A-infected animals, work in hepatitis A research labs, or travel to or work in countries with a high rate of hepatitis A should be immunized. Adults who were previously unvaccinated and who anticipate close contact with an international adoptee during the first 60 days after arrival in the [place] from a country with a high rate of hepatitis A should be immunized.  Hepatitis B vaccine. Adults who wish to be protected from this disease, have certain high-risk conditions, may be exposed to blood or other infectious body fluids, are household contacts or sex partners of hepatitis B positive people, are clients or workers in certain care facilities, or travel to or work in countries with a high rate of hepatitis B should be immunized.  Haemophilus influenzae type b (Hib) vaccine. A previously unvaccinated person with asplenia or sickle cell disease or having a scheduled splenectomy should receive 1 dose of Hib vaccine. Regardless of previous immunization, a recipient of a hematopoietic stem cell transplant should receive a  3-dose series 6-12 months after her successful transplant. Hib vaccine is not recommended for adults with HIV infection. Preventive Services / Frequency Ages 35 to 4 years  Blood pressure check.** / Every 3-5 years.  Lipid and cholesterol check.** / Every 5 years beginning at age 60.  Clinical breast exam.** / Every 3 years for women in their 71s and 10s.  BRCA-related cancer risk assessment.** / For women who have family members with a BRCA-related cancer (breast, ovarian, tubal, or peritoneal cancers).  Pap test.** / Every 2 years from ages 76 through 26. Every 3 years starting at age 61 through age 76 or 93 with a history of 3 consecutive normal Pap tests.  HPV screening.** / Every 3 years from ages 37 through ages 60 to 51 with a history of 3 consecutive normal Pap tests.  Hepatitis C blood test.** / For any individual with known risks for hepatitis C.  Skin self-exam. / Monthly.  Influenza vaccine. / Every year.  Tetanus, diphtheria, and acellular pertussis (Tdap, Td) vaccine.** / Consult your health care provider. Pregnant women should receive 1 dose of Tdap vaccine during each pregnancy. 1 dose of Td every 10 years.  Varicella vaccine.** / Consult your health care provider. Pregnant females who do not have evidence of immunity should receive the first dose after pregnancy.  HPV vaccine. / 3 doses over 6 months, if 93 and younger. The vaccine is not recommended for use in pregnant females. However, pregnancy testing is not needed before receiving a dose.  Measles, mumps, rubella (MMR) vaccine.** / You need at least 1 dose of MMR if you were born in 1957 or later. You may also need a 2nd dose. For females of childbearing age, rubella immunity should be determined. If there is no evidence of immunity, females who are not pregnant should be vaccinated. If there is no evidence of immunity, females who are  pregnant should delay immunization until after pregnancy.  Pneumococcal  13-valent conjugate (PCV13) vaccine.** / Consult your health care provider.  Pneumococcal polysaccharide (PPSV23) vaccine.** / 1 to 2 doses if you smoke cigarettes or if you have certain conditions.  Meningococcal vaccine.** / 1 dose if you are age 68 to 8 years and a Market researcher living in a residence hall, or have one of several medical conditions, you need to get vaccinated against meningococcal disease. You may also need additional booster doses.  Hepatitis A vaccine.** / Consult your health care provider.  Hepatitis B vaccine.** / Consult your health care provider.  Haemophilus influenzae type b (Hib) vaccine.** / Consult your health care provider. Ages 7 to 53 years  Blood pressure check.** / Every year.  Lipid and cholesterol check.** / Every 5 years beginning at age 25 years.  Lung cancer screening. / Every year if you are aged 11-80 years and have a 30-pack-year history of smoking and currently smoke or have quit within the past 15 years. Yearly screening is stopped once you have quit smoking for at least 15 years or develop a health problem that would prevent you from having lung cancer treatment.  Clinical breast exam.** / Every year after age 48 years.  BRCA-related cancer risk assessment.** / For women who have family members with a BRCA-related cancer (breast, ovarian, tubal, or peritoneal cancers).  Mammogram.** / Every year beginning at age 41 years and continuing for as long as you are in good health. Consult with your health care provider.  Pap test.** / Every 3 years starting at age 65 years through age 37 or 70 years with a history of 3 consecutive normal Pap tests.  HPV screening.** / Every 3 years from ages 72 years through ages 60 to 40 years with a history of 3 consecutive normal Pap tests.  Fecal occult blood test (FOBT) of stool. / Every year beginning at age 21 years and continuing until age 5 years. You may not need to do this test if you get  a colonoscopy every 10 years.  Flexible sigmoidoscopy or colonoscopy.** / Every 5 years for a flexible sigmoidoscopy or every 10 years for a colonoscopy beginning at age 35 years and continuing until age 48 years.  Hepatitis C blood test.** / For all people born from [birth year] through 1965 and any individual with known risks for hepatitis C.  Skin self-exam. / Monthly.  Influenza vaccine. / Every year.  Tetanus, diphtheria, and acellular pertussis (Tdap/Td) vaccine.** / Consult your health care provider. Pregnant women should receive 1 dose of Tdap vaccine during each pregnancy. 1 dose of Td every 10 years.  Varicella vaccine.** / Consult your health care provider. Pregnant females who do not have evidence of immunity should receive the first dose after pregnancy.  Zoster vaccine.** / 1 dose for adults aged 30 years or older.  Measles, mumps, rubella (MMR) vaccine.** / You need at least 1 dose of MMR if you were born in 1957 or later. You may also need a second dose. For females of childbearing age, rubella immunity should be determined. If there is no evidence of immunity, females who are not pregnant should be vaccinated. If there is no evidence of immunity, females who are pregnant should delay immunization until after pregnancy.  Pneumococcal 13-valent conjugate (PCV13) vaccine.** / Consult your health care provider.  Pneumococcal polysaccharide (PPSV23) vaccine.** / 1 to 2 doses if you smoke cigarettes or if you have certain conditions.  Meningococcal vaccine.** /  Consult your health care provider.  Hepatitis A vaccine.** / Consult your health care provider.  Hepatitis B vaccine.** / Consult your health care provider.  Haemophilus influenzae type b (Hib) vaccine.** / Consult your health care provider. Ages 64 years and over  Blood pressure check.** / Every year.  Lipid and cholesterol check.** / Every 5 years beginning at age 23 years.  Lung cancer screening. / Every year if you  are aged 16-80 years and have a 30-pack-year history of smoking and currently smoke or have quit within the past 15 years. Yearly screening is stopped once you have quit smoking for at least 15 years or develop a health problem that would prevent you from having lung cancer treatment.  Clinical breast exam.** / Every year after age 74 years.  BRCA-related cancer risk assessment.** / For women who have family members with a BRCA-related cancer (breast, ovarian, tubal, or peritoneal cancers).  Mammogram.** / Every year beginning at age 44 years and continuing for as long as you are in good health. Consult with your health care provider.  Pap test.** / Every 3 years starting at age 58 years through age 22 or 39 years with 3 consecutive normal Pap tests. Testing can be stopped between 65 and 70 years with 3 consecutive normal Pap tests and no abnormal Pap or HPV tests in the past 10 years.  HPV screening.** / Every 3 years from ages 64 years through ages 70 or 61 years with a history of 3 consecutive normal Pap tests. Testing can be stopped between 65 and 70 years with 3 consecutive normal Pap tests and no abnormal Pap or HPV tests in the past 10 years.  Fecal occult blood test (FOBT) of stool. / Every year beginning at age 40 years and continuing until age 27 years. You may not need to do this test if you get a colonoscopy every 10 years.  Flexible sigmoidoscopy or colonoscopy.** / Every 5 years for a flexible sigmoidoscopy or every 10 years for a colonoscopy beginning at age 7 years and continuing until age 32 years.  Hepatitis C blood test.** / For all people born from [birth year] through 1967 and any individual with known risks for hepatitis C.  Osteoporosis screening.** / A one-time screening for women ages 30 years and over and women at risk for fractures or osteoporosis.  Skin self-exam. / Monthly.  Influenza vaccine. / Every year.  Tetanus, diphtheria, and acellular pertussis (Tdap/Td)  vaccine.** / 1 dose of Td every 10 years.  Varicella vaccine.** / Consult your health care provider.  Zoster vaccine.** / 1 dose for adults aged 35 years or older.  Pneumococcal 13-valent conjugate (PCV13) vaccine.** / Consult your health care provider.  Pneumococcal polysaccharide (PPSV23) vaccine.** / 1 dose for all adults aged 46 years and older.  Meningococcal vaccine.** / Consult your health care provider.  Hepatitis A vaccine.** / Consult your health care provider.  Hepatitis B vaccine.** / Consult your health care provider.  Haemophilus influenzae type b (Hib) vaccine.** / Consult your health care provider. ** Family history and personal history of risk and conditions may change your health care provider's recommendations.   This information is not intended to replace advice given to you by your health care provider. Make sure you discuss any questions you have with your health care provider.   Document Released: 03/11/2001 Document Revised: 02/03/2014 Document Reviewed: 06/10/2010 Elsevier Interactive Patient Education [employer].

## 2014-11-29 ENCOUNTER — Telehealth: Payer: Self-pay | Admitting: Internal Medicine

## 2014-11-29 DIAGNOSIS — Z0000 Encounter for general adult medical examination without abnormal findings: Secondary | ICD-10-CM

## 2014-11-29 NOTE — Telephone Encounter (Signed)
Ordered

## 2014-11-29 NOTE — Telephone Encounter (Signed)
Malachi [facility]Gloria from [facility] an order for a pap that Dr. Yetta BarreJones put in is in future status and will need to be reordered You can call her at [phone].  She states if she's not there any of the girls there now about it

## 2014-12-01 LAB — CYTOLOGY - PAP

## 2014-12-03 ENCOUNTER — Encounter: Payer: Self-pay | Admitting: Internal Medicine

## 2014-12-03 LAB — HM PAP SMEAR

## 2015-05-24 ENCOUNTER — Ambulatory Visit: Payer: 59 | Admitting: Internal Medicine

## 2015-08-01 ENCOUNTER — Other Ambulatory Visit: Payer: Self-pay | Admitting: Internal Medicine

## 2015-09-08 IMAGING — CT CT ANGIO CHEST
2 of 6 series · 19 of 36 positions shown · IV contrast (APPLIED)
Comparison: None.

CLINICAL DATA: Chest pain.

EXAM:
CT ANGIOGRAPHY CHEST WITH CONTRAST
TECHNIQUE: Multidetector CT imaging of the chest was performed using the
standard protocol during bolus administration of intravenous
contrast. Multiplanar CT image reconstructions and MIPs were
obtained to evaluate the vascular anatomy.
CONTRAST:  100mL OMNIPAQUE IOHEXOL 350 MG/ML SOLN

[Series 5: pe 1.0 b26f · axial · 0.71mm/px · z∈[+955,+1212]mm · 18 of 287 slices shown]
[im 15/287  lung]
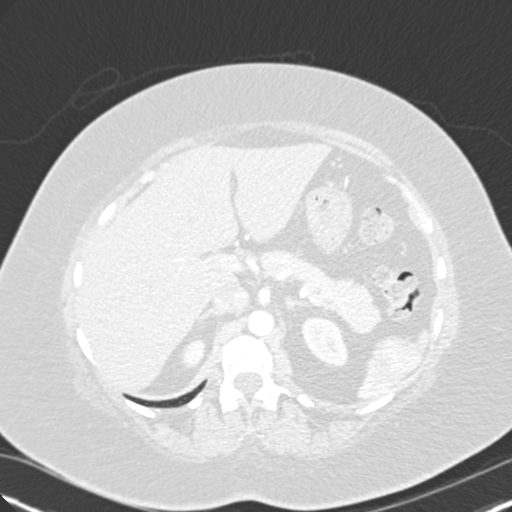
[im 29/287  mediastinal]
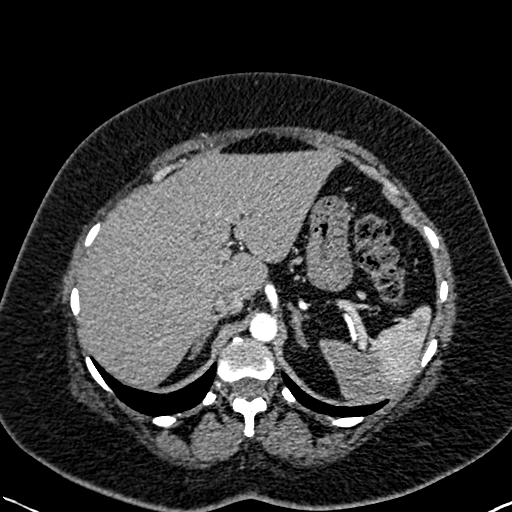
[im 43/287  lung]
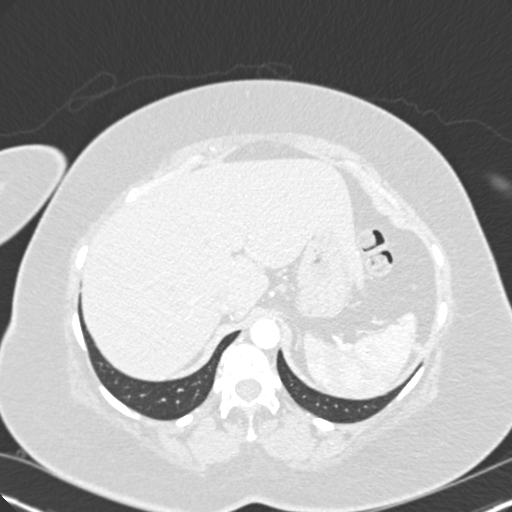
[im 58/287  mediastinal]
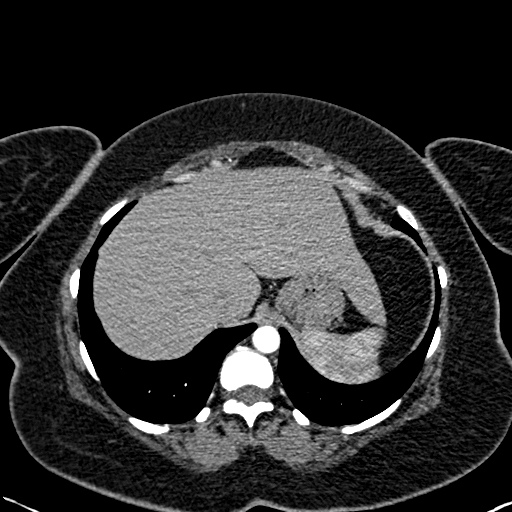
[im 72/287  lung]
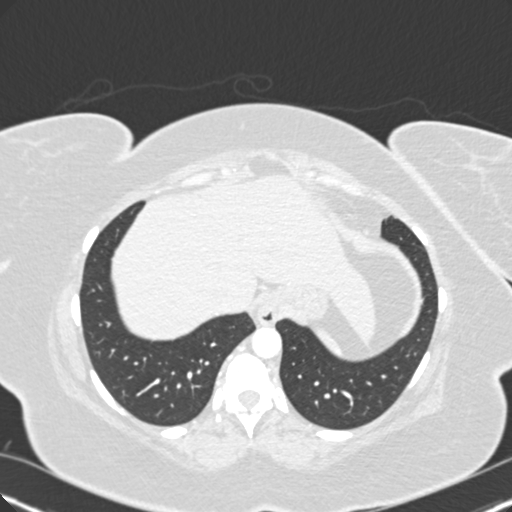
[im 86/287  mediastinal]
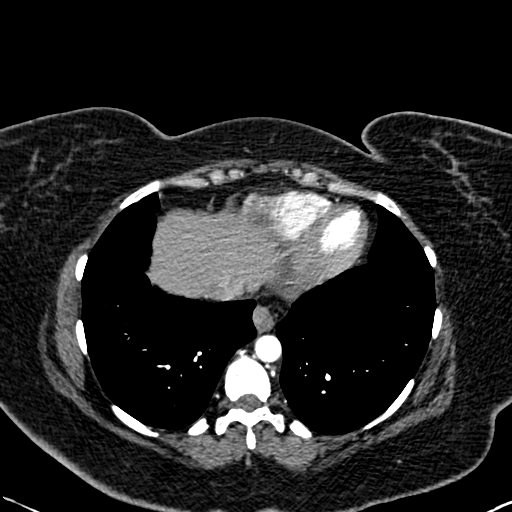
[im 101/287  lung]
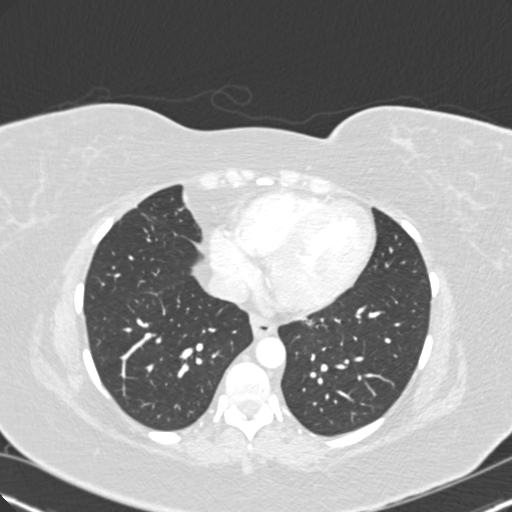
[im 115/287  mediastinal]
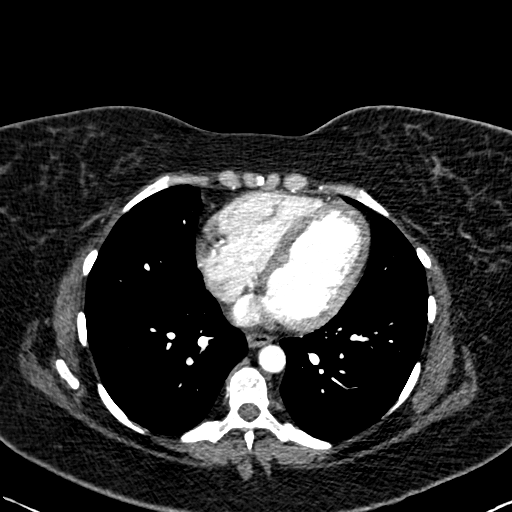
[im 129/287  lung]
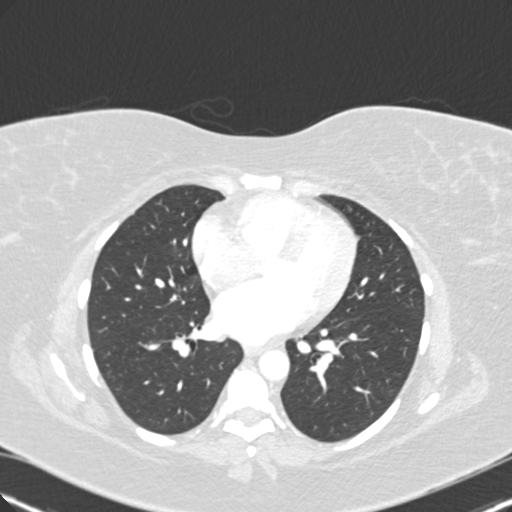
[im 158/287  mediastinal]
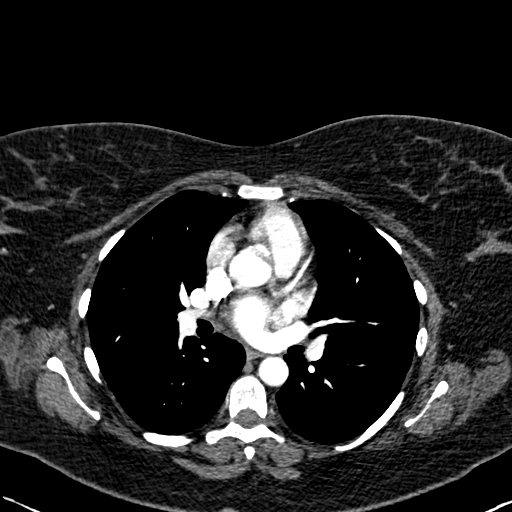
[im 172/287  lung]
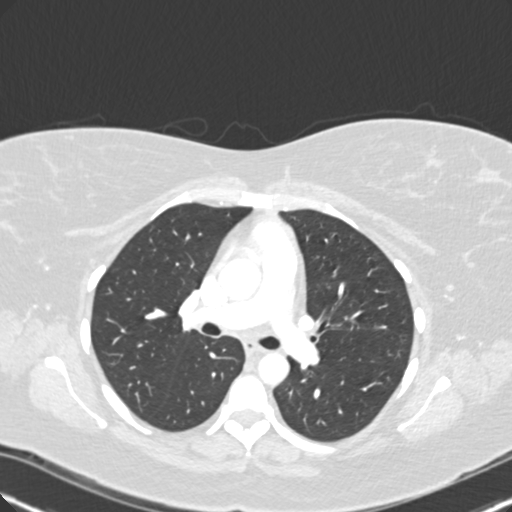
[im 186/287  mediastinal]
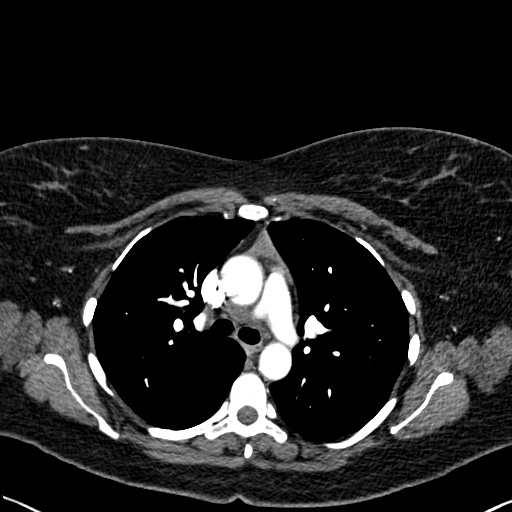
[im 201/287  lung]
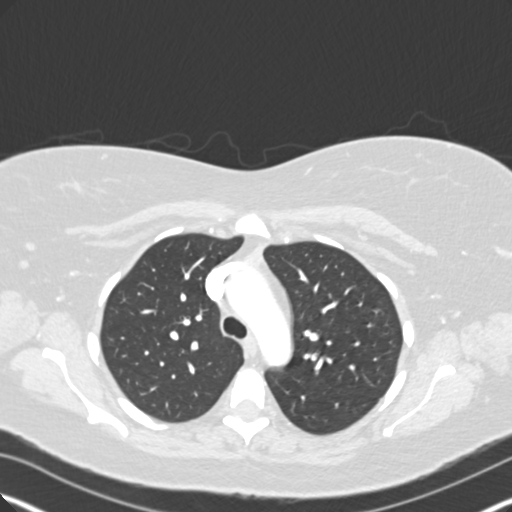
[im 215/287  mediastinal]
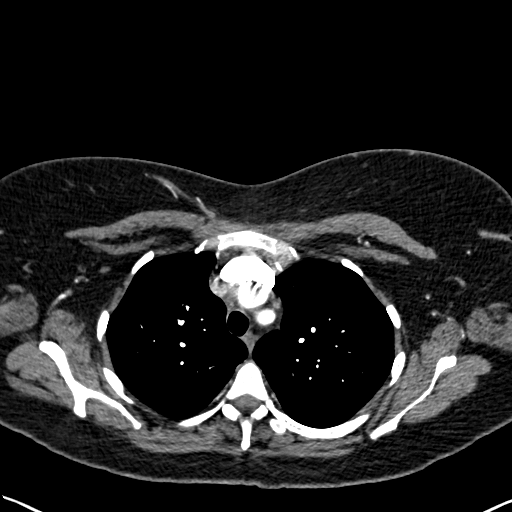
[im 229/287  lung]
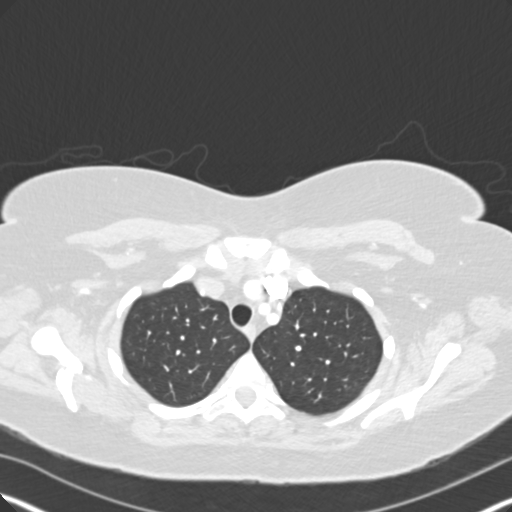
[im 244/287  mediastinal]
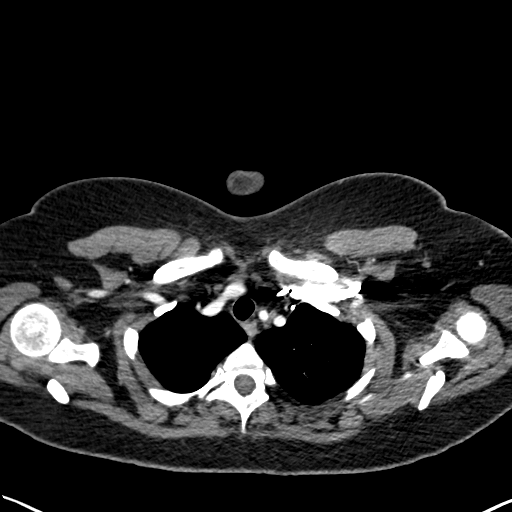
[im 258/287  lung]
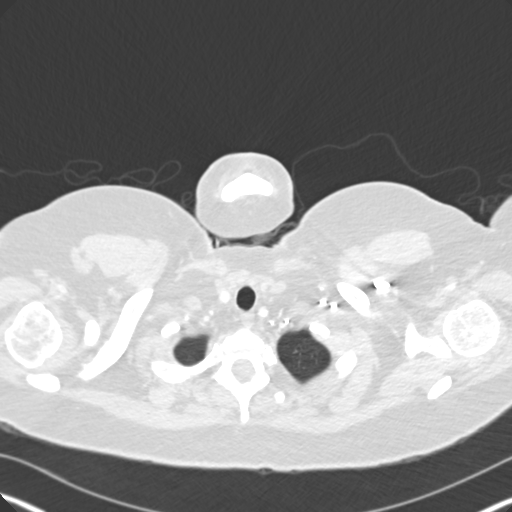
[im 272/287  mediastinal]
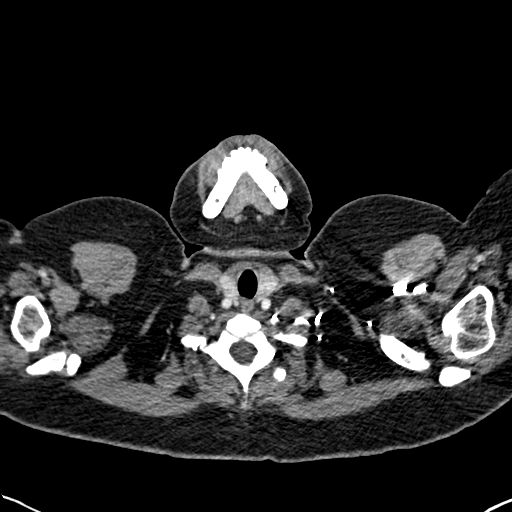

[Series 8: pe 2.0 coronal · coronal · 0.60mm/px · 1 of 127 slices shown]
[im 64/127  mediastinal]
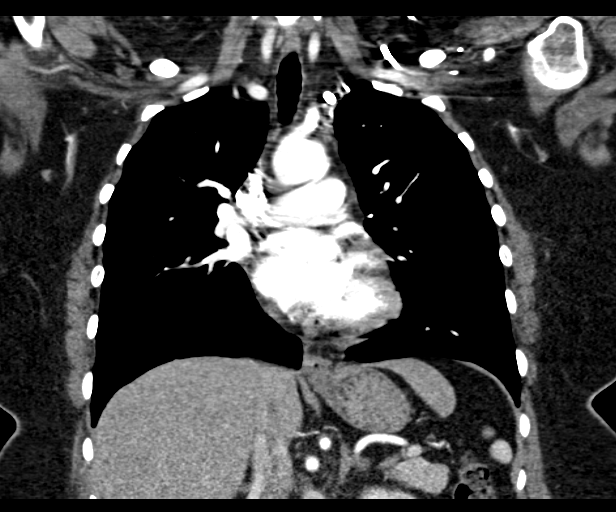

[19 of 36 positions shown; findings below may reference images not displayed]

FINDINGS: Chest wall:

No significant findings.

Mediastinum:

The heart is normal in size. No pericardial effusion. Minimal
residual thymic tissue noted in the anterior mediastinum. No
mediastinal or hilar mass or adenopathy. The esophagus is grossly
normal. There is a small hiatal hernia. Thoracic aorta is normal in
caliber. No dissection.

The pulmonary arterial tree is well opacified. No filling defects to
suggest pulmonary emboli.

The lungs are clear.

Upper abdomen:

No significant findings.

Review of the MIP images confirms the above findings.
IMPRESSION: No CT findings for pulmonary embolism.

Normal thoracic aorta.

No acute pulmonary findings.

## 2015-12-11 ENCOUNTER — Other Ambulatory Visit: Payer: Self-pay | Admitting: Internal Medicine

## 2015-12-13 ENCOUNTER — Telehealth: Payer: Self-pay | Admitting: Internal Medicine

## 2015-12-13 MED ORDER — NEBIVOLOL HCL 10 MG PO TABS
10.0000 mg | ORAL_TABLET | Freq: Every day | ORAL | 0 refills | Status: DC
Start: 2015-12-13 — End: 2016-01-09

## 2015-12-13 NOTE — Telephone Encounter (Signed)
Are you okay with 30 day fill of bystolic?

## 2015-12-13 NOTE — Telephone Encounter (Signed)
erx sent

## 2015-12-13 NOTE — Telephone Encounter (Signed)
Pt informed rx has been sent.  

## 2015-12-13 NOTE — Telephone Encounter (Signed)
BYSTOLIC 10 MG tablet   Patient is requesting  A refill on this medication. She has not been seen since Oct 2016. She has a appointment on Dec 13. They wanted to know if Dr. Yetta BarreJones would refill her medication one more month. Please advise or follow up, Thank you.

## 2015-12-13 NOTE — Telephone Encounter (Signed)
YES

## 2016-01-09 ENCOUNTER — Encounter: Payer: Self-pay | Admitting: Internal Medicine

## 2016-01-09 ENCOUNTER — Ambulatory Visit (INDEPENDENT_AMBULATORY_CARE_PROVIDER_SITE_OTHER): Payer: 59 | Admitting: Internal Medicine

## 2016-01-09 ENCOUNTER — Other Ambulatory Visit (INDEPENDENT_AMBULATORY_CARE_PROVIDER_SITE_OTHER): Payer: 59

## 2016-01-09 VITALS — BP 138/82 | HR 64 | Temp 98.4°F | Resp 16 | Ht 65.0 in | Wt 242.0 lb

## 2016-01-09 DIAGNOSIS — I10 Essential (primary) hypertension: Secondary | ICD-10-CM

## 2016-01-09 DIAGNOSIS — D539 Nutritional anemia, unspecified: Secondary | ICD-10-CM

## 2016-01-09 DIAGNOSIS — E7800 Pure hypercholesterolemia, unspecified: Secondary | ICD-10-CM | POA: Diagnosis not present

## 2016-01-09 DIAGNOSIS — R739 Hyperglycemia, unspecified: Secondary | ICD-10-CM

## 2016-01-09 DIAGNOSIS — K219 Gastro-esophageal reflux disease without esophagitis: Secondary | ICD-10-CM | POA: Diagnosis not present

## 2016-01-09 DIAGNOSIS — E876 Hypokalemia: Secondary | ICD-10-CM | POA: Diagnosis not present

## 2016-01-09 DIAGNOSIS — E669 Obesity, unspecified: Secondary | ICD-10-CM

## 2016-01-09 LAB — CBC WITH DIFFERENTIAL/PLATELET
BASOS ABS: 0 10*3/uL (ref 0.0–0.1)
Basophils Relative: 0.5 % (ref 0.0–3.0)
Eosinophils Absolute: 0 10*3/uL (ref 0.0–0.7)
Eosinophils Relative: 0.9 % (ref 0.0–5.0)
HEMATOCRIT: 36.1 % (ref 36.0–46.0)
HEMOGLOBIN: 11.6 g/dL — AB (ref 12.0–15.0)
LYMPHS PCT: 24.5 % (ref 12.0–46.0)
Lymphs Abs: 1.3 10*3/uL (ref 0.7–4.0)
MCHC: 32.1 g/dL (ref 30.0–36.0)
MONOS PCT: 6.6 % (ref 3.0–12.0)
Monocytes Absolute: 0.4 10*3/uL (ref 0.1–1.0)
NEUTROS ABS: 3.6 10*3/uL (ref 1.4–7.7)
Neutrophils Relative %: 67.5 % (ref 43.0–77.0)
PLATELETS: 344 10*3/uL (ref 150.0–400.0)
RBC: 5.18 Mil/uL — AB (ref 3.87–5.11)
RDW: 18.7 % — ABNORMAL HIGH (ref 11.5–15.5)
WBC: 5.3 10*3/uL (ref 4.0–10.5)

## 2016-01-09 LAB — LIPID PANEL
CHOL/HDL RATIO: 4
Cholesterol: 190 mg/dL (ref 0–200)
HDL: 43.6 mg/dL (ref >39.00)
LDL CALC: 127 mg/dL — AB (ref 0–99)
NONHDL: 146.38
TRIGLYCERIDES: 95 mg/dL (ref 0.0–149.0)
VLDL: 19 mg/dL (ref 0.0–40.0)

## 2016-01-09 LAB — COMPREHENSIVE METABOLIC PANEL
ALBUMIN: 3.7 g/dL (ref 3.5–5.2)
ALT: 11 U/L (ref 0–35)
AST: 12 U/L (ref 0–37)
Alkaline Phosphatase: 99 U/L (ref 39–117)
BILIRUBIN TOTAL: 0.3 mg/dL (ref 0.2–1.2)
BUN: 10 mg/dL (ref 6–23)
CALCIUM: 9 mg/dL (ref 8.4–10.5)
CO2: 28 meq/L (ref 19–32)
Chloride: 103 mEq/L (ref 96–112)
Creatinine, Ser: 0.59 mg/dL (ref 0.40–1.20)
GFR: 147.11 mL/min (ref >60.00)
Glucose, Bld: 87 mg/dL (ref 70–99)
Potassium: 4.1 mEq/L (ref 3.5–5.1)
Sodium: 139 mEq/L (ref 135–145)
Total Protein: 7.5 g/dL (ref 6.0–8.3)

## 2016-01-09 LAB — HEMOGLOBIN A1C: Hgb A1c MFr Bld: 5.7 % (ref 4.6–6.5)

## 2016-01-09 LAB — THYROID PANEL WITH TSH
Free Thyroxine Index: 2 (ref 1.4–3.8)
T3 Uptake: 31 % (ref 22–35)
T4, Total: 6.4 ug/dL (ref 4.5–12.0)
TSH: 1.36 mIU/L

## 2016-01-09 LAB — MAGNESIUM: Magnesium: 2.1 mg/dL (ref 1.5–2.5)

## 2016-01-09 MED ORDER — NEBIVOLOL HCL 10 MG PO TABS: 10.0000 mg | ORAL_TABLET | Freq: Every day | ORAL | 5 refills | Status: DC

## 2016-01-09 MED ORDER — TRIAMTERENE-HCTZ 50-25 MG PO CAPS: 1.0000 | ORAL_CAPSULE | Freq: Every morning | ORAL | 5 refills | Status: DC

## 2016-01-09 NOTE — Progress Notes (Signed)
Pre visit review using our clinic review tool, if applicable. No additional management support is needed unless otherwise documented below in the visit note. 

## 2016-01-09 NOTE — Patient Instructions (Signed)
Hypertension Hypertension, commonly called high blood pressure, is when the force of blood pumping through your arteries is too strong. Your arteries are the blood vessels that carry blood from your heart throughout your body. A blood pressure reading consists of a higher number over a lower number, such as 110/72. The higher number (systolic) is the pressure inside your arteries when your heart pumps. The lower number (diastolic) is the pressure inside your arteries when your heart relaxes. Ideally you want your blood pressure below 120/80. Hypertension forces your heart to work harder to pump blood. Your arteries may become narrow or stiff. Having untreated or uncontrolled hypertension can cause heart attack, stroke, kidney disease, and other problems. What increases the risk? Some risk factors for high blood pressure are controllable. Others are not. Risk factors you cannot control include:  Race. You may be at higher risk if you are African American.  Age. Risk increases with age.  Gender. Men are at higher risk than women before age 45 years. After age 65, women are at higher risk than men. Risk factors you can control include:  Not getting enough exercise or physical activity.  Being overweight.  Getting too much fat, sugar, calories, or salt in your diet.  Drinking too much alcohol. What are the signs or symptoms? Hypertension does not usually cause signs or symptoms. Extremely high blood pressure (hypertensive crisis) may cause headache, anxiety, shortness of breath, and nosebleed. How is this diagnosed? To check if you have hypertension, your health care provider will measure your blood pressure while you are seated, with your arm held at the level of your heart. It should be measured at least twice using the same arm. Certain conditions can cause a difference in blood pressure between your right and left arms. A blood pressure reading that is higher than normal on one occasion does  not mean that you need treatment. If it is not clear whether you have high blood pressure, you may be asked to return on a different day to have your blood pressure checked again. Or, you may be asked to monitor your blood pressure at home for 1 or more weeks. How is this treated? Treating high blood pressure includes making lifestyle changes and possibly taking medicine. Living a healthy lifestyle can help lower high blood pressure. You may need to change some of your habits. Lifestyle changes may include:  Following the DASH diet. This diet is high in fruits, vegetables, and whole grains. It is low in salt, red meat, and added sugars.  Keep your sodium intake below 2,300 mg per day.  Getting at least 30-45 minutes of aerobic exercise at least 4 times per week.  Losing weight if necessary.  Not smoking.  Limiting alcoholic beverages.  Learning ways to reduce stress. Your health care provider may prescribe medicine if lifestyle changes are not enough to get your blood pressure under control, and if one of the following is true:  You are 18-59 years of age and your systolic blood pressure is above 140.  You are 60 years of age or older, and your systolic blood pressure is above 150.  Your diastolic blood pressure is above 90.  You have diabetes, and your systolic blood pressure is over 140 or your diastolic blood pressure is over 90.  You have kidney disease and your blood pressure is above 140/90.  You have heart disease and your blood pressure is above 140/90. Your personal target blood pressure may vary depending on your medical   conditions, your age, and other factors. Follow these instructions at home:  Have your blood pressure rechecked as directed by your health care provider.  Take medicines only as directed by your health care provider. Follow the directions carefully. Blood pressure medicines must be taken as prescribed. The medicine does not work as well when you skip  doses. Skipping doses also puts you at risk for problems.  Do not smoke.  Monitor your blood pressure at home as directed by your health care provider. Contact a health care provider if:  You think you are having a reaction to medicines taken.  You have recurrent headaches or feel dizzy.  You have swelling in your ankles.  You have trouble with your vision. Get help right away if:  You develop a severe headache or confusion.  You have unusual weakness, numbness, or feel faint.  You have severe chest or abdominal pain.  You vomit repeatedly.  You have trouble breathing. This information is not intended to replace advice given to you by your health care provider. Make sure you discuss any questions you have with your health care provider. Document Released: 01/13/2005 Document Revised: 06/21/2015 Document Reviewed: 11/05/2012 Elsevier Interactive Patient Education  2017 Elsevier Inc.  

## 2016-01-09 NOTE — Progress Notes (Signed)
Subjective:  Patient ID: Caroline PicklesBobbie M Veilleux, female    DOB: 02/21/1980  Age: 35 y.o. MRN: [id number]  CC: Hypertension; Hyperlipidemia; and Fatigue   HPI Caroline Jones presents for follow-up. She complains of fatigue and feels sleepy during the day. She feels better after she takes a nap. Her husband said she does not snore and she denies insomnia or sleep apnea. She tells me her blood pressure has been well controlled. She has become a vegetarian and has heavy menstrual cycles. She denies shortness of breath, headache, blurred vision, numbness, weakness, or tingling.  Outpatient Medications Prior to Visit  Medication Sig Dispense Refill  . dexlansoprazole (DEXILANT) 60 MG capsule Take 1 capsule (60 mg total) by mouth daily. 90 capsule 3  . nebivolol (BYSTOLIC) 10 MG tablet Take 1 tablet (10 mg total) by mouth daily. 30 tablet 0  . triamterene-hydrochlorothiazide (DYAZIDE) 50-25 MG per capsule TAKE 1 CAPSULE BY MOUTH EVERY MORNING. (Patient not taking: Reported on 01/09/2016) 30 capsule 5   No facility-administered medications prior to visit.     ROS Review of Systems  Constitutional: Positive for fatigue. Negative for activity change, appetite change, diaphoresis and unexpected weight change.  HENT: Negative.   Eyes: Negative.  Negative for visual disturbance.  Respiratory: Negative.  Negative for apnea, cough, chest tightness, shortness of breath, wheezing and stridor.   Cardiovascular: Negative.  Negative for chest pain, palpitations and leg swelling.  Gastrointestinal: Negative.  Negative for abdominal pain, blood in stool, constipation, diarrhea, nausea and vomiting.  Endocrine: Negative.  Negative for cold intolerance and heat intolerance.  Genitourinary: Negative.  Negative for decreased urine volume, difficulty urinating, dysuria, frequency, hematuria and urgency.  Musculoskeletal: Negative.   Allergic/Immunologic: Negative.   Neurological: Negative.  Negative for  dizziness, tremors, syncope, weakness, light-headedness, numbness and headaches.  Hematological: Negative for adenopathy. Does not bruise/bleed easily.  Psychiatric/Behavioral: Negative.     Objective:  BP 138/82 (BP Location: Right Arm, Cuff Size: Large)   Pulse 64   Temp 98.4 F (36.9 C) (Oral)   Resp 16   Ht 5\' 5"  (1.651 m)   Wt 242 lb (109.8 kg)   LMP 12/25/2015   SpO2 98%   BMI 40.27 kg/m   BP Readings from Last 3 Encounters:  01/09/16 138/82  11/23/14 132/88  11/15/13 112/78    Wt Readings from Last 3 Encounters:  01/09/16 242 lb (109.8 kg)  11/23/14 244 lb (110.7 kg)  11/15/13 235 lb (106.6 kg)    Physical Exam  Constitutional: She is oriented to person, place, and time. She appears well-developed and well-nourished. No distress.  HENT:  Head: Normocephalic and atraumatic.  Mouth/Throat: Oropharynx is clear and moist. No oropharyngeal exudate.  Eyes: Conjunctivae are normal. Right eye exhibits no discharge. Left eye exhibits no discharge. No scleral icterus.  Neck: Normal range of motion. Neck supple. No JVD present. No tracheal deviation present. No thyromegaly present.  Cardiovascular: Normal rate, regular rhythm, normal heart sounds and intact distal pulses.  Exam reveals no gallop and no friction rub.   No murmur heard. Pulmonary/Chest: Effort normal and breath sounds normal. No stridor. No respiratory distress. She has no wheezes. She has no rales. She exhibits no tenderness.  Abdominal: Soft. Bowel sounds are normal. She exhibits no distension and no mass. There is no tenderness. There is no rebound and no guarding.  Musculoskeletal: Normal range of motion. She exhibits no edema, tenderness or deformity.  Lymphadenopathy:    She has no cervical adenopathy.  Neurological: She is oriented to person, place, and time.  Skin: Skin is warm and dry. No rash noted. She is not diaphoretic. No erythema. No pallor.  Vitals reviewed.   Lab Results  Component Value  Date   WBC 5.3 01/09/2016   HGB 11.6 (L) 01/09/2016   HCT 36.1 01/09/2016   PLT 344.0 01/09/2016   GLUCOSE 87 01/09/2016   CHOL 190 01/09/2016   TRIG 95.0 01/09/2016   HDL 43.60 01/09/2016   LDLCALC 127 (H) 01/09/2016   ALT 11 01/09/2016   AST 12 01/09/2016   NA 139 01/09/2016   K 4.1 01/09/2016   CL 103 01/09/2016   CREATININE 0.59 01/09/2016   BUN 10 01/09/2016   CO2 28 01/09/2016   TSH 1.36 01/09/2016   HGBA1C 5.7 01/09/2016    No results found.  Assessment & Plan:   Karen KitchensBobbie was seen today for hypertension, hyperlipidemia and fatigue.  Diagnoses and all orders for this visit:  Essential hypertension, benign- her blood pressure is well-controlled, electrolytes and renal function are stable. -     Comprehensive metabolic panel; Future -     triamterene-hydrochlorothiazide (DYAZIDE) 50-25 MG capsule; Take 1 capsule by mouth every morning. -     nebivolol (BYSTOLIC) 10 MG tablet; Take 1 tablet (10 mg total) by mouth daily.  Gastroesophageal reflux disease without esophagitis- her symptoms are well controlled with a PPI, she has no concerning symptoms and is not anemic -     CBC with Differential/Platelet; Future  Hyperglycemia- she has very mild prediabetes, she agrees to work on her lifestyle modifications. -     Comprehensive metabolic panel; Future -     Hemoglobin A1c; Future  Hypokalemia- her potassium level is normal now -     Magnesium; Future -     Comprehensive metabolic panel; Future  Pure hypercholesterolemia- her Framingham risk was less than 1% so I do not recommend that she start taking a statin -     Lipid panel; Future -     Thyroid Panel With TSH; Future  Obesity (BMI 30-39.9)- labs are negative for any secondary metabolic causes, she agrees to work on this with lifestyle modifications. -     Thyroid Panel With TSH; Future  Deficiency anemia- she has a mild hypochromic/microcytic anemia, I have asked her to return to screen for vitamin  deficiency. -     Vitamin B12; Future -     IBC panel; Future -     Ferritin; Future -     Folate; Future   I have changed Ms. Feltner's triamterene-hydrochlorothiazide. I am also having her maintain her dexlansoprazole and nebivolol.  Meds ordered this encounter  Medications  . triamterene-hydrochlorothiazide (DYAZIDE) 50-25 MG capsule    Sig: Take 1 capsule by mouth every morning.    Dispense:  30 capsule    Refill:  5  . nebivolol (BYSTOLIC) 10 MG tablet    Sig: Take 1 tablet (10 mg total) by mouth daily.    Dispense:  30 tablet    Refill:  5     Follow-up: Return in about 6 months (around 07/09/2016).  Sanda Lingerhomas Glema Takaki, MD

## 2016-01-10 ENCOUNTER — Other Ambulatory Visit: Payer: Self-pay | Admitting: Internal Medicine

## 2016-01-10 ENCOUNTER — Encounter: Payer: Self-pay | Admitting: Internal Medicine

## 2016-01-10 DIAGNOSIS — D509 Iron deficiency anemia, unspecified: Secondary | ICD-10-CM | POA: Insufficient documentation

## 2016-01-11 ENCOUNTER — Other Ambulatory Visit (INDEPENDENT_AMBULATORY_CARE_PROVIDER_SITE_OTHER): Payer: 59

## 2016-01-11 ENCOUNTER — Encounter: Payer: Self-pay | Admitting: Internal Medicine

## 2016-01-11 ENCOUNTER — Other Ambulatory Visit: Payer: Self-pay | Admitting: Internal Medicine

## 2016-01-11 DIAGNOSIS — E538 Deficiency of other specified B group vitamins: Secondary | ICD-10-CM

## 2016-01-11 DIAGNOSIS — D539 Nutritional anemia, unspecified: Secondary | ICD-10-CM | POA: Diagnosis not present

## 2016-01-11 DIAGNOSIS — D519 Vitamin B12 deficiency anemia, unspecified: Secondary | ICD-10-CM

## 2016-01-11 LAB — FERRITIN: FERRITIN: 11.3 ng/mL (ref 10.0–291.0)

## 2016-01-11 LAB — IBC PANEL
IRON: 30 ug/dL — AB (ref 42–145)
Saturation Ratios: 8.5 % — ABNORMAL LOW (ref 20.0–50.0)
TRANSFERRIN: 252 mg/dL (ref 212.0–360.0)

## 2016-01-11 LAB — FOLATE: FOLATE: 13.6 ng/mL (ref >5.9)

## 2016-01-11 LAB — VITAMIN B12: Vitamin B-12: 232 pg/mL (ref 211–911)

## 2016-01-11 MED ORDER — FERRALET 90 90-1 MG PO TABS: 1.0000 | ORAL_TABLET | Freq: Every day | ORAL | 3 refills | Status: DC

## 2016-01-11 MED ORDER — CYANOCOBALAMIN 500 MCG/0.1ML NA SOLN: 0.1000 mL | NASAL | 11 refills | Status: DC

## 2016-01-14 ENCOUNTER — Other Ambulatory Visit: Payer: Self-pay | Admitting: Internal Medicine

## 2016-01-14 ENCOUNTER — Telehealth: Payer: Self-pay | Admitting: Internal Medicine

## 2016-01-14 DIAGNOSIS — D513 Other dietary vitamin B12 deficiency anemia: Secondary | ICD-10-CM | POA: Insufficient documentation

## 2016-01-14 DIAGNOSIS — I10 Essential (primary) hypertension: Secondary | ICD-10-CM

## 2016-01-14 DIAGNOSIS — D500 Iron deficiency anemia secondary to blood loss (chronic): Secondary | ICD-10-CM

## 2016-01-14 MED ORDER — FERROUS SULFATE 325 (65 FE) MG PO TABS: 325.0000 mg | ORAL_TABLET | Freq: Three times a day (TID) | ORAL | 5 refills | Status: DC

## 2016-01-14 MED ORDER — CHLORTHALIDONE 25 MG PO TABS: 25.0000 mg | ORAL_TABLET | Freq: Every day | ORAL | 1 refills | Status: DC

## 2016-01-14 MED ORDER — CYANOCOBALAMIN 2000 MCG PO TABS: 2000.0000 ug | ORAL_TABLET | Freq: Every day | ORAL | 3 refills | Status: DC

## 2016-01-14 NOTE — Telephone Encounter (Signed)
Changed to high dose oral option

## 2016-01-14 NOTE — Telephone Encounter (Signed)
Pt informed of ferrous sulfate sent to [facility].   Pt would like to know if the sublingual b12 would be an alternative to the nasal spray b12? If so, can she have it changed to that.

## 2016-01-14 NOTE — Telephone Encounter (Signed)
changed

## 2016-01-14 NOTE — Telephone Encounter (Signed)
Please advise 

## 2016-01-14 NOTE — Telephone Encounter (Signed)
States that ferralet is not covered by insurance.  Is requesting something in place of this to be sent to [facility] on [address].

## 2016-01-14 NOTE — Telephone Encounter (Signed)
Pt informed of change

## 2016-06-18 ENCOUNTER — Other Ambulatory Visit: Payer: Self-pay | Admitting: Internal Medicine

## 2016-06-18 DIAGNOSIS — K219 Gastro-esophageal reflux disease without esophagitis: Secondary | ICD-10-CM

## 2016-07-09 ENCOUNTER — Ambulatory Visit: Payer: 59 | Admitting: Internal Medicine

## 2016-07-14 ENCOUNTER — Other Ambulatory Visit: Payer: Self-pay | Admitting: Internal Medicine

## 2016-07-14 DIAGNOSIS — I10 Essential (primary) hypertension: Secondary | ICD-10-CM

## 2016-07-17 ENCOUNTER — Ambulatory Visit: Payer: 59 | Admitting: Internal Medicine

## 2016-07-22 ENCOUNTER — Ambulatory Visit: Payer: 59 | Admitting: Internal Medicine

## 2016-08-05 ENCOUNTER — Encounter: Payer: Self-pay | Admitting: Internal Medicine

## 2016-08-05 ENCOUNTER — Ambulatory Visit (INDEPENDENT_AMBULATORY_CARE_PROVIDER_SITE_OTHER): Payer: 59 | Admitting: Internal Medicine

## 2016-08-05 ENCOUNTER — Other Ambulatory Visit (INDEPENDENT_AMBULATORY_CARE_PROVIDER_SITE_OTHER): Payer: 59

## 2016-08-05 VITALS — BP 148/84 | HR 58 | Temp 98.4°F | Resp 16 | Ht 65.0 in | Wt 242.5 lb

## 2016-08-05 DIAGNOSIS — D513 Other dietary vitamin B12 deficiency anemia: Secondary | ICD-10-CM | POA: Diagnosis not present

## 2016-08-05 DIAGNOSIS — D500 Iron deficiency anemia secondary to blood loss (chronic): Secondary | ICD-10-CM

## 2016-08-05 DIAGNOSIS — I10 Essential (primary) hypertension: Secondary | ICD-10-CM

## 2016-08-05 DIAGNOSIS — E669 Obesity, unspecified: Secondary | ICD-10-CM

## 2016-08-05 LAB — BASIC METABOLIC PANEL
BUN: 10 mg/dL (ref 6–23)
CALCIUM: 9.2 mg/dL (ref 8.4–10.5)
CHLORIDE: 103 meq/L (ref 96–112)
CO2: 26 meq/L (ref 19–32)
CREATININE: 0.53 mg/dL (ref 0.40–1.20)
GFR: 165.98 mL/min (ref >60.00)
Glucose, Bld: 94 mg/dL (ref 70–99)
Potassium: 3.5 mEq/L (ref 3.5–5.1)
SODIUM: 139 meq/L (ref 135–145)

## 2016-08-05 LAB — CBC WITH DIFFERENTIAL/PLATELET
BASOS ABS: 0 10*3/uL (ref 0.0–0.1)
Basophils Relative: 0.5 % (ref 0.0–3.0)
EOS PCT: 1.3 % (ref 0.0–5.0)
Eosinophils Absolute: 0.1 10*3/uL (ref 0.0–0.7)
HEMATOCRIT: 38.3 % (ref 36.0–46.0)
Hemoglobin: 12.4 g/dL (ref 12.0–15.0)
LYMPHS ABS: 1.6 10*3/uL (ref 0.7–4.0)
LYMPHS PCT: 28.1 % (ref 12.0–46.0)
MCHC: 32.4 g/dL (ref 30.0–36.0)
MCV: 75 fl — AB (ref 78.0–100.0)
MONOS PCT: 5.6 % (ref 3.0–12.0)
Monocytes Absolute: 0.3 10*3/uL (ref 0.1–1.0)
NEUTROS ABS: 3.7 10*3/uL (ref 1.4–7.7)
Neutrophils Relative %: 64.5 % (ref 43.0–77.0)
Platelets: 327 10*3/uL (ref 150.0–400.0)
RBC: 5.1 Mil/uL (ref 3.87–5.11)
RDW: 16.3 % — ABNORMAL HIGH (ref 11.5–15.5)
WBC: 5.7 10*3/uL (ref 4.0–10.5)

## 2016-08-05 LAB — FERRITIN: Ferritin: 27.9 ng/mL (ref 10.0–291.0)

## 2016-08-05 LAB — IBC PANEL
Iron: 49 ug/dL (ref 42–145)
Saturation Ratios: 14.8 % — ABNORMAL LOW (ref 20.0–50.0)
Transferrin: 237 mg/dL (ref 212.0–360.0)

## 2016-08-05 LAB — FOLATE: FOLATE: 17.8 ng/mL (ref >5.9)

## 2016-08-05 LAB — VITAMIN B12: VITAMIN B 12: 588 pg/mL (ref 211–911)

## 2016-08-05 MED ORDER — CHLORTHALIDONE 25 MG PO TABS: 25.0000 mg | ORAL_TABLET | Freq: Every day | ORAL | 1 refills | Status: DC

## 2016-08-05 MED ORDER — PHENTERMINE HCL 37.5 MG PO CAPS: 37.5000 mg | ORAL_CAPSULE | ORAL | 1 refills | Status: DC

## 2016-08-05 NOTE — Patient Instructions (Signed)

## 2016-08-05 NOTE — Progress Notes (Signed)
Subjective:  Patient ID: Caroline Jones, female    DOB: August 31, 1980  Age: 36 y.o. MRN: [id number]  CC: Anemia and Hypertension   HPI Caroline Jones presents for f/up - She complains of fatigue and inability to lose weight. Her blood pressure has not been well controlled. She has not been taking chlorthalidone because it didn't help with the edema. She was not aware that it was being prescribed for blood pressure control as well. She is also not been taking the iron or B12 supplementation because she forgets to take it.  Outpatient Medications Prior to Visit  Medication Sig Dispense Refill  . BYSTOLIC 10 MG tablet TAKE 1 TABLET (10 MG TOTAL) BY MOUTH DAILY. 30 tablet 5  . cyanocobalamin 2000 MCG tablet Take 1 tablet (2,000 mcg total) by mouth daily. 90 tablet 3  . DEXILANT 60 MG capsule TAKE ONE CAPSULE BY MOUTH EVERY DAY 90 capsule 1  . ferrous sulfate 325 (65 FE) MG tablet Take 1 tablet (325 mg total) by mouth 3 (three) times daily with meals. 90 tablet 5  . chlorthalidone (HYGROTON) 25 MG tablet Take 1 tablet (25 mg total) by mouth daily. (Patient not taking: Reported on 08/05/2016) 90 tablet 1   No facility-administered medications prior to visit.     ROS Review of Systems  Constitutional: Positive for fatigue. Negative for activity change, appetite change, diaphoresis and unexpected weight change.  HENT: Negative.   Eyes: Negative for visual disturbance.  Respiratory: Negative.  Negative for apnea, cough, chest tightness, shortness of breath and wheezing.   Cardiovascular: Negative for chest pain, palpitations and leg swelling.  Gastrointestinal: Negative for abdominal pain, constipation, diarrhea, nausea and vomiting.  Endocrine: Negative.   Genitourinary: Negative.  Negative for difficulty urinating, hematuria, vaginal bleeding and vaginal discharge.  Musculoskeletal: Negative.  Negative for back pain and myalgias.  Skin: Negative.  Negative for color change and rash.    Allergic/Immunologic: Negative.   Neurological: Negative.  Negative for dizziness, weakness and numbness.  Hematological: Negative.   Psychiatric/Behavioral: Negative.     Objective:  BP (!) 148/84 (BP Location: Right Arm, Patient Position: Sitting, Cuff Size: Large)   Pulse (!) 58   Temp 98.4 F (36.9 C) (Oral)   Resp 16   Ht 5\' 5"  (1.651 m)   Wt 242 lb 8 oz (110 kg)   LMP 08/04/2016   SpO2 100%   BMI 40.35 kg/m   BP Readings from Last 3 Encounters:  08/05/16 (!) 148/84  01/09/16 138/82  11/23/14 132/88    Wt Readings from Last 3 Encounters:  08/05/16 242 lb 8 oz (110 kg)  01/09/16 242 lb (109.8 kg)  11/23/14 244 lb (110.7 kg)    Physical Exam  Constitutional: She is oriented to person, place, and time. No distress.  HENT:  Mouth/Throat: Oropharynx is clear and moist. No oropharyngeal exudate.  Eyes: Conjunctivae are normal. Right eye exhibits no discharge. Left eye exhibits no discharge. No scleral icterus.  Neck: Normal range of motion. Neck supple. No JVD present. No thyromegaly present.  Cardiovascular: Normal rate, regular rhythm and intact distal pulses.  Exam reveals no gallop.   No murmur heard. Pulmonary/Chest: Effort normal and breath sounds normal. No respiratory distress. She has no wheezes. She has no rales. She exhibits no tenderness.  Abdominal: Soft. Bowel sounds are normal. She exhibits no distension and no mass. There is no tenderness. There is no rebound and no guarding.  Musculoskeletal: Normal range of motion. She exhibits no  edema, tenderness or deformity.  Lymphadenopathy:    She has no cervical adenopathy.  Neurological: She is alert and oriented to person, place, and time.  Skin: Skin is warm and dry. No rash noted. She is not diaphoretic. No erythema. No pallor.  Vitals reviewed.   Lab Results  Component Value Date   WBC 5.7 08/05/2016   HGB 12.4 08/05/2016   HCT 38.3 08/05/2016   PLT 327.0 08/05/2016   GLUCOSE 94 08/05/2016   CHOL  190 01/09/2016   TRIG 95.0 01/09/2016   HDL 43.60 01/09/2016   LDLCALC 127 (H) 01/09/2016   ALT 11 01/09/2016   AST 12 01/09/2016   NA 139 08/05/2016   K 3.5 08/05/2016   CL 103 08/05/2016   CREATININE 0.53 08/05/2016   BUN 10 08/05/2016   CO2 26 08/05/2016   TSH 1.36 01/09/2016   HGBA1C 5.7 01/09/2016    No results found.  Assessment & Plan:   Caroline Jones was seen today for anemia and hypertension.  Diagnoses and all orders for this visit:  Iron deficiency anemia due to chronic blood loss- her H&H and iron level are normal now. She is not willing to take iron replacement therapy. Will continue to monitor for the development of anemia again. -     CBC with Differential/Platelet; Future -     IBC panel; Future -     Ferritin; Future  Essential hypertension, benign- her blood pressure is not adequately well controlled. Will restart the chlorthalidone. Her electrolytes and renal function are normal. -     Basic metabolic panel; Future -     chlorthalidone (HYGROTON) 25 MG tablet; Take 1 tablet (25 mg total) by mouth daily.  Other dietary vitamin B12 deficiency anemia- her H&H are normal now. Her B12 level is normal. Will continue to monitor for the development of B12 deficiency. -     CBC with Differential/Platelet; Future -     Vitamin B12; Future -     Folate; Future  Obesity (BMI 30-39.9)- in addition to lifestyle modifications she will take phentermine to help decrease her caloric intake. -     phentermine 37.5 MG capsule; Take 1 capsule (37.5 mg total) by mouth every morning.   I am having Caroline Jones start on phentermine. I am also having her maintain her ferrous sulfate, cyanocobalamin, DEXILANT, BYSTOLIC, and chlorthalidone.  Meds ordered this encounter  Medications  . chlorthalidone (HYGROTON) 25 MG tablet    Sig: Take 1 tablet (25 mg total) by mouth daily.    Dispense:  90 tablet    Refill:  1  . phentermine 37.5 MG capsule    Sig: Take 1 capsule (37.5 mg total)  by mouth every morning.    Dispense:  30 capsule    Refill:  1     Follow-up: Return in about 2 months (around 10/06/2016).  Sanda Lingerhomas Aidden Markovic, MD

## 2016-08-30 DIAGNOSIS — H16223 Keratoconjunctivitis sicca, not specified as Sjogren's, bilateral: Secondary | ICD-10-CM | POA: Diagnosis not present

## 2016-08-30 DIAGNOSIS — H40033 Anatomical narrow angle, bilateral: Secondary | ICD-10-CM | POA: Diagnosis not present

## 2016-10-07 ENCOUNTER — Other Ambulatory Visit (INDEPENDENT_AMBULATORY_CARE_PROVIDER_SITE_OTHER): Payer: 59

## 2016-10-07 ENCOUNTER — Encounter: Payer: Self-pay | Admitting: Internal Medicine

## 2016-10-07 ENCOUNTER — Ambulatory Visit (INDEPENDENT_AMBULATORY_CARE_PROVIDER_SITE_OTHER): Payer: 59 | Admitting: Internal Medicine

## 2016-10-07 VITALS — BP 130/90 | HR 70 | Temp 99.0°F | Resp 16 | Ht 65.0 in | Wt 234.2 lb

## 2016-10-07 DIAGNOSIS — I10 Essential (primary) hypertension: Secondary | ICD-10-CM

## 2016-10-07 DIAGNOSIS — Z23 Encounter for immunization: Secondary | ICD-10-CM | POA: Diagnosis not present

## 2016-10-07 LAB — BASIC METABOLIC PANEL
BUN: 11 mg/dL (ref 6–23)
CALCIUM: 9 mg/dL (ref 8.4–10.5)
CO2: 25 mEq/L (ref 19–32)
Chloride: 103 mEq/L (ref 96–112)
Creatinine, Ser: 0.5 mg/dL (ref 0.40–1.20)
GFR: 177.36 mL/min (ref >60.00)
GLUCOSE: 88 mg/dL (ref 70–99)
POTASSIUM: 3.7 meq/L (ref 3.5–5.1)
SODIUM: 137 meq/L (ref 135–145)

## 2016-10-07 NOTE — Patient Instructions (Signed)

## 2016-10-07 NOTE — Progress Notes (Signed)
Subjective:  Patient ID: Caroline Jones, female    DOB: Jan 05, 1981  Age: 36 y.o. MRN: [id number]  CC: Hypertension   HPI Caroline Jones presents for f/up - she has lost weight since her last visit with phentermine and lifestyle modifications. She feels well and offers no complaints today.  Outpatient Medications Prior to Visit  Medication Sig Dispense Refill  . BYSTOLIC 10 MG tablet TAKE 1 TABLET (10 MG TOTAL) BY MOUTH DAILY. 30 tablet 5  . DEXILANT 60 MG capsule TAKE ONE CAPSULE BY MOUTH EVERY DAY 90 capsule 1  . chlorthalidone (HYGROTON) 25 MG tablet Take 1 tablet (25 mg total) by mouth daily. (Patient not taking: Reported on 10/07/2016) 90 tablet 1  . cyanocobalamin 2000 MCG tablet Take 1 tablet (2,000 mcg total) by mouth daily. (Patient not taking: Reported on 10/07/2016) 90 tablet 3  . ferrous sulfate 325 (65 FE) MG tablet Take 1 tablet (325 mg total) by mouth 3 (three) times daily with meals. (Patient not taking: Reported on 10/07/2016) 90 tablet 5  . phentermine 37.5 MG capsule Take 1 capsule (37.5 mg total) by mouth every morning. (Patient not taking: Reported on 10/07/2016) 30 capsule 1   No facility-administered medications prior to visit.     ROS Review of Systems  Constitutional: Negative for appetite change, diaphoresis, fatigue and unexpected weight change.  HENT: Negative.   Eyes: Negative for visual disturbance.  Respiratory: Negative.  Negative for cough, chest tightness, shortness of breath and wheezing.   Cardiovascular: Negative for chest pain, palpitations and leg swelling.  Gastrointestinal: Negative.  Negative for abdominal pain, constipation, diarrhea, nausea and vomiting.  Endocrine: Negative.   Genitourinary: Negative.  Negative for difficulty urinating.  Musculoskeletal: Negative.  Negative for back pain and myalgias.  Skin: Negative.   Allergic/Immunologic: Negative.   Neurological: Negative.  Negative for dizziness, weakness, light-headedness and  headaches.  Hematological: Negative for adenopathy. Does not bruise/bleed easily.  Psychiatric/Behavioral: Negative.     Objective:  BP 130/90 (BP Location: Left Arm, Patient Position: Sitting, Cuff Size: Large)   Pulse 70   Temp 99 F (37.2 C) (Oral)   Resp 16   Ht 5\' 5"  (1.651 m)   Wt 234 lb 4 oz (106.3 kg)   SpO2 99%   BMI 38.98 kg/m   BP Readings from Last 3 Encounters:  10/07/16 130/90  08/05/16 (!) 148/84  01/09/16 138/82    Wt Readings from Last 3 Encounters:  10/07/16 234 lb 4 oz (106.3 kg)  08/05/16 242 lb 8 oz (110 kg)  01/09/16 242 lb (109.8 kg)    Physical Exam  Constitutional: She is oriented to person, place, and time. No distress.  HENT:  Mouth/Throat: Oropharynx is clear and moist. No oropharyngeal exudate.  Eyes: Conjunctivae are normal. Right eye exhibits no discharge. Left eye exhibits no discharge. No scleral icterus.  Neck: Normal range of motion. Neck supple. No JVD present. No thyromegaly present.  Cardiovascular: Normal rate, regular rhythm and intact distal pulses.  Exam reveals no gallop and no friction rub.   No murmur heard. Pulmonary/Chest: Effort normal and breath sounds normal. No respiratory distress. She has no wheezes. She has no rales. She exhibits no tenderness.  Abdominal: Soft. Bowel sounds are normal. She exhibits no distension and no mass. There is no tenderness. There is no rebound and no guarding.  Musculoskeletal: Normal range of motion. She exhibits no edema, tenderness or deformity.  Lymphadenopathy:    She has no cervical adenopathy.  Neurological:  She is alert and oriented to person, place, and time.  Skin: Skin is warm and dry. No rash noted. She is not diaphoretic. No erythema. No pallor.  Vitals reviewed.   Lab Results  Component Value Date   WBC 5.7 08/05/2016   HGB 12.4 08/05/2016   HCT 38.3 08/05/2016   PLT 327.0 08/05/2016   GLUCOSE 88 10/07/2016   CHOL 190 01/09/2016   TRIG 95.0 01/09/2016   HDL 43.60  01/09/2016   LDLCALC 127 (H) 01/09/2016   ALT 11 01/09/2016   AST 12 01/09/2016   NA 137 10/07/2016   K 3.7 10/07/2016   CL 103 10/07/2016   CREATININE 0.50 10/07/2016   BUN 11 10/07/2016   CO2 25 10/07/2016   TSH 1.36 01/09/2016   HGBA1C 5.7 01/09/2016    No results found.  Assessment & Plan:   Caroline Jones was seen today for hypertension.  Diagnoses and all orders for this visit:  Essential hypertension, benign- her BP is well controlled, lytes and renal fxn are normal, will cont the current meds -     Basic metabolic panel; Future  Need for influenza vaccination -     Flu Vaccine QUAD 36+ mos IM   I am having Caroline Jones maintain her ferrous sulfate, cyanocobalamin, DEXILANT, BYSTOLIC, chlorthalidone, and phentermine.  No orders of the defined types were placed in this encounter.    Follow-up: Return in about 3 months (around 01/06/2017).  Sanda Linger, MD

## 2017-01-11 ENCOUNTER — Other Ambulatory Visit: Payer: Self-pay | Admitting: Internal Medicine

## 2017-01-11 DIAGNOSIS — I10 Essential (primary) hypertension: Secondary | ICD-10-CM

## 2017-01-12 ENCOUNTER — Ambulatory Visit: Payer: 59 | Admitting: Internal Medicine

## 2017-02-18 ENCOUNTER — Ambulatory Visit: Payer: 59 | Admitting: Internal Medicine

## 2017-07-15 ENCOUNTER — Other Ambulatory Visit: Payer: Self-pay | Admitting: Internal Medicine

## 2017-07-15 DIAGNOSIS — I10 Essential (primary) hypertension: Secondary | ICD-10-CM

## 2017-08-16 ENCOUNTER — Other Ambulatory Visit: Payer: Self-pay | Admitting: Internal Medicine

## 2017-08-16 DIAGNOSIS — I10 Essential (primary) hypertension: Secondary | ICD-10-CM

## 2017-08-20 ENCOUNTER — Telehealth: Payer: Self-pay | Admitting: Internal Medicine

## 2017-08-20 ENCOUNTER — Encounter: Payer: Self-pay | Admitting: Internal Medicine

## 2017-08-20 NOTE — Telephone Encounter (Signed)
Copied from CRM [id number]. Topic: Quick Communication - Rx Refill/Question >> Aug 20, 2017 10:33 AM Oneal GroutSebastian, Jennifer S wrote: Medication: BYSTOLIC 10 MG tablet   Has the patient contacted their pharmacy? Yes.   (Agent: If no, request that the patient contact the pharmacy for the refill.) (Agent: If yes, when and what did the pharmacy advise?)  Preferred Pharmacy (with phone number or street name): [facility] on [address]  Agent: Please be advised that RX refills may take up to 3 business days. We ask that you follow-up with your pharmacy.

## 2017-08-21 ENCOUNTER — Other Ambulatory Visit: Payer: Self-pay

## 2017-08-21 DIAGNOSIS — I10 Essential (primary) hypertension: Secondary | ICD-10-CM

## 2017-08-21 MED ORDER — NEBIVOLOL HCL 10 MG PO TABS: 10.0000 mg | ORAL_TABLET | Freq: Every day | ORAL | 0 refills | Status: DC

## 2017-08-29 DIAGNOSIS — H40033 Anatomical narrow angle, bilateral: Secondary | ICD-10-CM | POA: Diagnosis not present

## 2017-08-29 DIAGNOSIS — H04123 Dry eye syndrome of bilateral lacrimal glands: Secondary | ICD-10-CM | POA: Diagnosis not present

## 2017-09-07 DIAGNOSIS — Z23 Encounter for immunization: Secondary | ICD-10-CM | POA: Diagnosis not present

## 2017-09-10 ENCOUNTER — Encounter: Payer: Self-pay | Admitting: Internal Medicine

## 2017-09-10 ENCOUNTER — Ambulatory Visit: Payer: 59 | Admitting: Internal Medicine

## 2017-09-10 ENCOUNTER — Other Ambulatory Visit (INDEPENDENT_AMBULATORY_CARE_PROVIDER_SITE_OTHER): Payer: 59

## 2017-09-10 VITALS — BP 144/84 | HR 55 | Temp 98.1°F | Ht 65.0 in | Wt 235.0 lb

## 2017-09-10 DIAGNOSIS — D500 Iron deficiency anemia secondary to blood loss (chronic): Secondary | ICD-10-CM | POA: Diagnosis not present

## 2017-09-10 DIAGNOSIS — I10 Essential (primary) hypertension: Secondary | ICD-10-CM | POA: Diagnosis not present

## 2017-09-10 DIAGNOSIS — Z0000 Encounter for general adult medical examination without abnormal findings: Secondary | ICD-10-CM

## 2017-09-10 DIAGNOSIS — D513 Other dietary vitamin B12 deficiency anemia: Secondary | ICD-10-CM

## 2017-09-10 DIAGNOSIS — I7300 Raynaud's syndrome without gangrene: Secondary | ICD-10-CM | POA: Diagnosis not present

## 2017-09-10 DIAGNOSIS — E559 Vitamin D deficiency, unspecified: Secondary | ICD-10-CM | POA: Insufficient documentation

## 2017-09-10 LAB — CBC WITH DIFFERENTIAL/PLATELET
BASOS PCT: 0.7 % (ref 0.0–3.0)
Basophils Absolute: 0 10*3/uL (ref 0.0–0.1)
EOS PCT: 0.6 % (ref 0.0–5.0)
Eosinophils Absolute: 0 10*3/uL (ref 0.0–0.7)
HCT: 38.2 % (ref 36.0–46.0)
Hemoglobin: 12.3 g/dL (ref 12.0–15.0)
LYMPHS ABS: 1.3 10*3/uL (ref 0.7–4.0)
Lymphocytes Relative: 26.6 % (ref 12.0–46.0)
MCHC: 32.3 g/dL (ref 30.0–36.0)
MCV: 75.8 fl — AB (ref 78.0–100.0)
MONOS PCT: 7.2 % (ref 3.0–12.0)
Monocytes Absolute: 0.4 10*3/uL (ref 0.1–1.0)
NEUTROS PCT: 64.9 % (ref 43.0–77.0)
Neutro Abs: 3.3 10*3/uL (ref 1.4–7.7)
PLATELETS: 328 10*3/uL (ref 150.0–400.0)
RBC: 5.03 Mil/uL (ref 3.87–5.11)
RDW: 16.5 % — AB (ref 11.5–15.5)
WBC: 5 10*3/uL (ref 4.0–10.5)

## 2017-09-10 LAB — COMPREHENSIVE METABOLIC PANEL
ALT: 10 U/L (ref 0–35)
AST: 12 U/L (ref 0–37)
Albumin: 3.7 g/dL (ref 3.5–5.2)
Alkaline Phosphatase: 83 U/L (ref 39–117)
BUN: 11 mg/dL (ref 6–23)
CO2: 29 meq/L (ref 19–32)
Calcium: 9.4 mg/dL (ref 8.4–10.5)
Chloride: 104 mEq/L (ref 96–112)
Creatinine, Ser: 0.6 mg/dL (ref 0.40–1.20)
GFR: 143.01 mL/min (ref >60.00)
GLUCOSE: 96 mg/dL (ref 70–99)
POTASSIUM: 4 meq/L (ref 3.5–5.1)
SODIUM: 140 meq/L (ref 135–145)
Total Bilirubin: 0.3 mg/dL (ref 0.2–1.2)
Total Protein: 7.5 g/dL (ref 6.0–8.3)

## 2017-09-10 LAB — IBC PANEL
IRON: 46 ug/dL (ref 42–145)
Saturation Ratios: 13.4 % — ABNORMAL LOW (ref 20.0–50.0)
TRANSFERRIN: 246 mg/dL (ref 212.0–360.0)

## 2017-09-10 LAB — VITAMIN D 25 HYDROXY (VIT D DEFICIENCY, FRACTURES): VITD: 19.23 ng/mL — ABNORMAL LOW (ref 30.00–100.00)

## 2017-09-10 LAB — LIPID PANEL
CHOL/HDL RATIO: 4
Cholesterol: 185 mg/dL (ref 0–200)
HDL: 43 mg/dL (ref >39.00)
LDL Cholesterol: 126 mg/dL — ABNORMAL HIGH (ref 0–99)
NONHDL: 142.42
Triglycerides: 82 mg/dL (ref 0.0–149.0)
VLDL: 16.4 mg/dL (ref 0.0–40.0)

## 2017-09-10 LAB — HCG, QUANTITATIVE, PREGNANCY: Quantitative HCG: 0.27 m[IU]/mL

## 2017-09-10 LAB — FOLATE: Folate: 18.6 ng/mL (ref >5.9)

## 2017-09-10 LAB — FERRITIN: Ferritin: 18.6 ng/mL (ref 10.0–291.0)

## 2017-09-10 LAB — VITAMIN B12: VITAMIN B 12: 355 pg/mL (ref 211–911)

## 2017-09-10 MED ORDER — NIFEDIPINE ER OSMOTIC RELEASE 30 MG PO TB24: 30.0000 mg | ORAL_TABLET | Freq: Every day | ORAL | 1 refills | Status: DC

## 2017-09-10 MED ORDER — CHOLECALCIFEROL 1.25 MG (50000 UT) PO CAPS: 50000.0000 [IU] | ORAL_CAPSULE | ORAL | 1 refills | Status: DC

## 2017-09-10 NOTE — Progress Notes (Unsigned)
pr

## 2017-09-10 NOTE — Progress Notes (Signed)
Subjective:  Patient ID: Caroline Jones, female    DOB: 1980-05-11  Age: 37 y.o. MRN: [id number]  CC: Hypertension and Annual Exam   HPI Caroline Jones presents for a CPX.  She is not taking any antihypertensives.  She is also not taking the B12 or iron supplements.  She is not sure why she said she just stopped taking them.  Since I last saw,  she has noticed intermittent episodes of feeling like her feet are cold.  She has also noticed color changes in her hands and feet.  She describes color variations that include pale, red, and blue.  Other than the feet feeling cold she does not have any discomfort or paresthesias associated with this.  She does not know if her blood pressure is well controlled and she does not monitor it.  She denies any recent episodes of headache, blurred vision, chest pain, shortness of breath, or edema.  Outpatient Medications Prior to Visit  Medication Sig Dispense Refill  . RESTASIS 0.05 % ophthalmic emulsion     . nebivolol (BYSTOLIC) 10 MG tablet Take 1 tablet (10 mg total) by mouth daily. 30 tablet 0  . chlorthalidone (HYGROTON) 25 MG tablet Take 1 tablet (25 mg total) by mouth daily. (Patient not taking: Reported on 10/07/2016) 90 tablet 1  . cyanocobalamin 2000 MCG tablet Take 1 tablet (2,000 mcg total) by mouth daily. (Patient not taking: Reported on 10/07/2016) 90 tablet 3  . DEXILANT 60 MG capsule TAKE ONE CAPSULE BY MOUTH EVERY DAY 90 capsule 1  . ferrous sulfate 325 (65 FE) MG tablet Take 1 tablet (325 mg total) by mouth 3 (three) times daily with meals. (Patient not taking: Reported on 10/07/2016) 90 tablet 5  . phentermine 37.5 MG capsule Take 1 capsule (37.5 mg total) by mouth every morning. (Patient not taking: Reported on 10/07/2016) 30 capsule 1   No facility-administered medications prior to visit.     ROS Review of Systems  Constitutional: Negative.  Negative for diaphoresis, fatigue and unexpected weight change.  HENT: Negative.   Eyes:  Negative for visual disturbance.  Respiratory: Negative for cough, chest tightness, shortness of breath and wheezing.   Cardiovascular: Negative for chest pain, palpitations and leg swelling.  Gastrointestinal: Negative for abdominal pain, constipation, diarrhea, nausea and vomiting.  Endocrine: Negative.   Genitourinary: Negative.  Negative for difficulty urinating.  Musculoskeletal: Negative.  Negative for arthralgias and myalgias.  Skin: Positive for color change. Negative for pallor, rash and wound.  Neurological: Negative.  Negative for dizziness, weakness and light-headedness.  Hematological: Negative for adenopathy. Does not bruise/bleed easily.  Psychiatric/Behavioral: Negative.     Objective:  BP (!) 144/84 (BP Location: Left Arm, Patient Position: Sitting, Cuff Size: Large)   Pulse (!) 55   Temp 98.1 F (36.7 C) (Oral)   Ht 5\' 5"  (1.651 m)   Wt 235 lb (106.6 kg)   LMP 08/30/2017   SpO2 98%   BMI 39.11 kg/m   BP Readings from Last 3 Encounters:  09/10/17 (!) 144/84  10/07/16 130/90  08/05/16 (!) 148/84    Wt Readings from Last 3 Encounters:  09/10/17 235 lb (106.6 kg)  10/07/16 234 lb 4 oz (106.3 kg)  08/05/16 242 lb 8 oz (110 kg)    Physical Exam  Constitutional: She is oriented to person, place, and time. No distress.  HENT:  Mouth/Throat: Oropharynx is clear and moist. No oropharyngeal exudate.  Eyes: Conjunctivae are normal. No scleral icterus.  Neck: Normal  range of motion. Neck supple. No JVD present. No thyromegaly present.  Cardiovascular: Normal rate, regular rhythm and normal heart sounds. Exam reveals no friction rub.  No murmur heard. Pulmonary/Chest: Effort normal and breath sounds normal. She has no wheezes. She has no rales.  Abdominal: Soft. Bowel sounds are normal. She exhibits no mass. There is no hepatosplenomegaly. There is no tenderness.  Musculoskeletal: Normal range of motion. She exhibits no edema, tenderness or deformity.    Lymphadenopathy:    She has no cervical adenopathy.  Neurological: She is alert and oriented to person, place, and time.  Skin: Skin is warm and dry. She is not diaphoretic. No pallor.  Psychiatric: She has a normal mood and affect. Her behavior is normal. Judgment and thought content normal.  Vitals reviewed.   Lab Results  Component Value Date   WBC 5.0 09/10/2017   HGB 12.3 09/10/2017   HCT 38.2 09/10/2017   PLT 328.0 09/10/2017   GLUCOSE 96 09/10/2017   CHOL 185 09/10/2017   TRIG 82.0 09/10/2017   HDL 43.00 09/10/2017   LDLCALC 126 (H) 09/10/2017   ALT 10 09/10/2017   AST 12 09/10/2017   NA 140 09/10/2017   K 4.0 09/10/2017   CL 104 09/10/2017   CREATININE 0.60 09/10/2017   BUN 11 09/10/2017   CO2 29 09/10/2017   TSH 1.36 01/09/2016   HGBA1C 5.7 01/09/2016    No results found.  Assessment & Plan:   Caroline KitchensBobbie was seen Jones for hypertension and annual exam.  Diagnoses and all orders for this visit:  Essential hypertension, benign- Her blood pressure is not adequately well controlled.  I will treat the vitamin D deficiency.  Otherwise, her labs are negative for secondary causes or endorgan damage.  It also sounds like she has developed Raynaud's phenomenon so I have asked her to start taking nifedipine.  She agrees to work on her lifestyle modifications as well. -     NIFEdipine (PROCARDIA XL) 30 MG 24 hr tablet; Take 1 tablet (30 mg total) by mouth daily. -     VITAMIN D 25 Hydroxy (Vit-D Deficiency, Fractures); Future -     Comprehensive metabolic panel; Future -     hCG, quantitative, pregnancy; Future  Raynaud's phenomenon without gangrene -     NIFEdipine (PROCARDIA XL) 30 MG 24 hr tablet; Take 1 tablet (30 mg total) by mouth daily. -     hCG, quantitative, pregnancy; Future  Iron deficiency anemia due to chronic blood loss- Her H&H and iron level are normal now -     CBC with Differential/Platelet; Future -     IBC panel; Future -     Ferritin; Future -      hCG, quantitative, pregnancy; Future  Routine general medical examination at a health care facility- Exam completed, labs reviewed, vaccines reviewed and updated, her Pap smear is up-to-date, patient education material was given. -     Lipid panel; Future  Other dietary vitamin B12 deficiency anemia- Her H&H and B12 level are normal now. -     CBC with Differential/Platelet; Future -     Vitamin B12; Future -     Folate; Future  Vitamin D deficiency disease -     Cholecalciferol 50000 units capsule; Take 1 capsule (50,000 Units total) by mouth once a week.   I have discontinued Cletis M. Heacox's ferrous sulfate, cyanocobalamin, DEXILANT, chlorthalidone, phentermine, and nebivolol. I am also having her start on NIFEdipine and Cholecalciferol. Additionally, I am having  her maintain her RESTASIS.  Meds ordered this encounter  Medications  . NIFEdipine (PROCARDIA XL) 30 MG 24 hr tablet    Sig: Take 1 tablet (30 mg total) by mouth daily.    Dispense:  90 tablet    Refill:  1  . Cholecalciferol 50000 units capsule    Sig: Take 1 capsule (50,000 Units total) by mouth once a week.    Dispense:  12 capsule    Refill:  1     Follow-up: Return in about 3 months (around 12/11/2017).  Sanda Lingerhomas Veronnica Hennings, MD

## 2017-09-10 NOTE — Patient Instructions (Signed)
Raynaud Phenomenon Raynaud phenomenon is a condition that affects the blood vessels (arteries) that carry blood to your fingers and toes. The arteries that supply blood to your ears or the tip of your nose might also be affected. Raynaud phenomenon causes the arteries to temporarily narrow. As a result, the flow of blood to the affected areas is temporarily decreased. This usually occurs in response to cold temperatures or stress. During an attack, the skin in the affected areas turns white. You may also feel tingling or numbness in those areas. Attacks usually last for only a brief period, and then the blood flow to the area returns to normal. In most cases, Raynaud phenomenon does not cause serious health problems. What are the causes? For many people with this condition, the cause is not known. Raynaud phenomenon is sometimes associated with other diseases, such as scleroderma or lupus. What increases the risk? Raynaud phenomenon can affect anyone, but it develops most often in people who are 20-40 years old. It affects more females than males. What are the signs or symptoms? Symptoms of Raynaud phenomenon may occur when you are exposed to cold temperatures or when you have emotional stress. The symptoms may last for a few minutes or up to several hours. They usually affect your fingers but may also affect your toes, ears, or the tip of your nose. Symptoms may include:  Changes in skin color. The skin in the affected areas will turn pale or white. The skin may then change from white to bluish to red as normal blood flow returns to the area.  Numbness, tingling, or pain in the affected areas.  In severe cases, sores may develop in the affected areas. How is this diagnosed? Your health care provider will do a physical exam and take your medical history. You may be asked to put your hands in cold water to check for a reaction to cold temperature. Blood tests may be done to check for other diseases or  conditions. Your health care provider may also order a test to check the movement of blood through your arteries and veins (vascular ultrasound). How is this treated? Treatment often involves making lifestyle changes and taking steps to control your exposure to cold temperatures. For more severe cases, medicine (calcium channel blockers) may be used to improve blood flow. Surgery is sometimes done to block the nerves that control the affected arteries, but this is rare. Follow these instructions at home:  Avoid exposure to cold by taking these steps: ? If possible, stay indoors during cold weather. ? When you go outside during cold weather, dress in layers and wear mittens, a hat, a scarf, and warm footwear. ? Wear mittens or gloves when handling ice or frozen food. ? Use holders for glasses or cans containing cold drinks. ? Let warm water run for a while before taking a shower or bath. ? Warm up the car before driving in cold weather.  If possible, avoid stressful and emotional situations. Exercise, meditation, and yoga may help you cope with stress. Biofeedback may be useful.  Do not use any tobacco products, including cigarettes, chewing tobacco, or electronic cigarettes. If you need help quitting, ask your health care provider.  Avoid secondhand smoke.  Limit your use of caffeine. Switch to decaffeinated coffee, tea, and soda. Avoid chocolate.  Wear loose fitting socks and comfortable, roomy shoes.  Avoid vibrating tools and machinery.  Take medicines only as directed by your health care provider. Contact a health care provider if:    Your discomfort becomes worse despite lifestyle changes.  You develop sores on your fingers or toes that do not heal.  Your fingers or toes turn black.  You have breaks in the skin on your fingers or toes.  You have a fever.  You have pain or swelling in your joints.  You have a rash.  Your symptoms occur on only one side of your body. This  information is not intended to replace advice given to you by your health care provider. Make sure you discuss any questions you have with your health care provider. Document Released: 01/11/2000 Document Revised: 06/21/2015 Document Reviewed: 07/18/2015 Elsevier Interactive Patient Education  2017 [employer]  

## 2017-12-10 ENCOUNTER — Ambulatory Visit: Payer: 59 | Admitting: Internal Medicine

## 2017-12-22 ENCOUNTER — Ambulatory Visit: Payer: 59 | Admitting: Internal Medicine

## 2018-01-26 ENCOUNTER — Ambulatory Visit: Payer: 59 | Admitting: Internal Medicine

## 2018-03-10 ENCOUNTER — Other Ambulatory Visit: Payer: Self-pay | Admitting: Internal Medicine

## 2018-03-10 DIAGNOSIS — I10 Essential (primary) hypertension: Secondary | ICD-10-CM

## 2018-03-10 DIAGNOSIS — I7300 Raynaud's syndrome without gangrene: Secondary | ICD-10-CM

## 2018-07-10 ENCOUNTER — Telehealth: Payer: Self-pay | Admitting: Internal Medicine

## 2018-07-10 DIAGNOSIS — I7300 Raynaud's syndrome without gangrene: Secondary | ICD-10-CM

## 2018-07-10 DIAGNOSIS — I10 Essential (primary) hypertension: Secondary | ICD-10-CM

## 2018-07-12 ENCOUNTER — Encounter: Payer: Self-pay | Admitting: Internal Medicine

## 2018-07-19 ENCOUNTER — Other Ambulatory Visit: Payer: Self-pay | Admitting: Internal Medicine

## 2018-07-19 DIAGNOSIS — I7300 Raynaud's syndrome without gangrene: Secondary | ICD-10-CM

## 2018-07-19 DIAGNOSIS — I10 Essential (primary) hypertension: Secondary | ICD-10-CM

## 2018-07-19 MED ORDER — NIFEDIPINE ER OSMOTIC RELEASE 30 MG PO TB24: 30.0000 mg | ORAL_TABLET | Freq: Every day | ORAL | 3 refills | Status: DC

## 2018-07-19 NOTE — Telephone Encounter (Signed)
Patient is scheduled for a medication follow up on June 30th. Can this med be refilled to get her though?

## 2018-07-27 ENCOUNTER — Ambulatory Visit (INDEPENDENT_AMBULATORY_CARE_PROVIDER_SITE_OTHER): Payer: 59 | Admitting: Internal Medicine

## 2018-07-27 VITALS — BP 126/87 | Wt 229.0 lb

## 2018-07-27 DIAGNOSIS — D500 Iron deficiency anemia secondary to blood loss (chronic): Secondary | ICD-10-CM | POA: Diagnosis not present

## 2018-07-27 DIAGNOSIS — E559 Vitamin D deficiency, unspecified: Secondary | ICD-10-CM

## 2018-07-27 DIAGNOSIS — D513 Other dietary vitamin B12 deficiency anemia: Secondary | ICD-10-CM

## 2018-07-27 DIAGNOSIS — I10 Essential (primary) hypertension: Secondary | ICD-10-CM

## 2018-07-27 DIAGNOSIS — Z1231 Encounter for screening mammogram for malignant neoplasm of breast: Secondary | ICD-10-CM

## 2018-07-27 NOTE — Progress Notes (Addendum)
Virtual Visit via Video Note  I connected with Caroline Jones on 07/30/18 at 11:30 AM EDT by a video enabled telemedicine application and verified that I am speaking with the correct person using two identifiers.   I discussed the limitations of evaluation and management by telemedicine and the availability of in person appointments. The patient expressed understanding and agreed to proceed.  History of Present Illness: Caroline Jones checked in for virtual visit.  Caroline Jones was not willing to come in for an in person visit due to the COVID-19 pandemic.  Caroline Jones tells me that her blood pressure has been well controlled.  Caroline Jones reports that her recent blood pressures were 129/81 and 126/87.  Caroline Jones tells me her current weight is 229 pounds.  Caroline Jones has been working on her lifestyle modifications and denies any signs and symptoms related to hypertension.  Caroline Jones denies headache, blurred vision, chest pain, shortness of breath, or DOE.  Caroline Jones tells me Caroline Jones is not currently taking an iron or potassium supplement.  Caroline Jones complains of a few recent muscle cramps and muscle twitches.  Caroline Jones also has a recurrence of lower extremity edema that Caroline Jones says occurs every summer.    Observations/Objective: Healthy-appearing female.  Speech is fluent.  No acute distress.  Calm, cooperative, appropriate.  Lab Results  Component Value Date   WBC 5.1 07/28/2018   HGB 12.7 07/28/2018   HCT 40.0 07/28/2018   PLT 365.0 07/28/2018   GLUCOSE 89 07/28/2018   CHOL 185 09/10/2017   TRIG 82.0 09/10/2017   HDL 43.00 09/10/2017   LDLCALC 126 (H) 09/10/2017   ALT 10 09/10/2017   AST 12 09/10/2017   NA 139 07/28/2018   K 3.2 (L) 07/28/2018   CL 103 07/28/2018   CREATININE 0.54 07/28/2018   BUN 8 07/28/2018   CO2 27 07/28/2018   TSH 1.36 01/09/2016   HGBA1C 5.7 01/09/2016     Assessment and Plan: Based on her reports her blood pressure has been adequately well controlled.  Will continue the current dose of nifedipine.  Her potassium level is down to  3.2 so I have asked her to start taking a potassium supplement.  Her iron level is low but Caroline Jones is not anemic.  I have asked her to start taking a daily iron supplement.  Caroline Jones will continue to work on her lifestyle modifications.   Follow Up Instructions: Caroline Jones agrees to adhere to the above recommendations.  Caroline Jones will come in soon for a Pap smear.  Caroline Jones will continue to work on her lifestyle modifications.  Caroline Jones will let me know if Caroline Jones develops any new or worsening symptoms.    I discussed the assessment and treatment plan with the patient. The patient was provided an opportunity to ask questions and all were answered. The patient agreed with the plan and demonstrated an understanding of the instructions.   The patient was advised to call back or seek an in-person evaluation if the symptoms worsen or if the condition fails to improve as anticipated.  I provided 30 minutes of non-face-to-face time during this encounter.   Scarlette Calico, MD

## 2018-07-28 ENCOUNTER — Encounter: Payer: Self-pay | Admitting: Internal Medicine

## 2018-07-28 ENCOUNTER — Other Ambulatory Visit (INDEPENDENT_AMBULATORY_CARE_PROVIDER_SITE_OTHER): Payer: 59

## 2018-07-28 ENCOUNTER — Other Ambulatory Visit: Payer: Self-pay | Admitting: Internal Medicine

## 2018-07-28 DIAGNOSIS — I10 Essential (primary) hypertension: Secondary | ICD-10-CM | POA: Diagnosis not present

## 2018-07-28 DIAGNOSIS — D513 Other dietary vitamin B12 deficiency anemia: Secondary | ICD-10-CM

## 2018-07-28 DIAGNOSIS — E559 Vitamin D deficiency, unspecified: Secondary | ICD-10-CM | POA: Diagnosis not present

## 2018-07-28 DIAGNOSIS — E876 Hypokalemia: Secondary | ICD-10-CM | POA: Insufficient documentation

## 2018-07-28 DIAGNOSIS — D500 Iron deficiency anemia secondary to blood loss (chronic): Secondary | ICD-10-CM

## 2018-07-28 LAB — BASIC METABOLIC PANEL
BUN: 8 mg/dL (ref 6–23)
CO2: 27 mEq/L (ref 19–32)
Calcium: 8.7 mg/dL (ref 8.4–10.5)
Chloride: 103 mEq/L (ref 96–112)
Creatinine, Ser: 0.54 mg/dL (ref 0.40–1.20)
GFR: 151.27 mL/min (ref >60.00)
Glucose, Bld: 89 mg/dL (ref 70–99)
Potassium: 3.2 mEq/L — ABNORMAL LOW (ref 3.5–5.1)
Sodium: 139 mEq/L (ref 135–145)

## 2018-07-28 LAB — CBC WITH DIFFERENTIAL/PLATELET
Basophils Absolute: 0 10*3/uL (ref 0.0–0.1)
Basophils Relative: 0.9 % (ref 0.0–3.0)
Eosinophils Absolute: 0.1 10*3/uL (ref 0.0–0.7)
Eosinophils Relative: 1.3 % (ref 0.0–5.0)
HCT: 40 % (ref 36.0–46.0)
Hemoglobin: 12.7 g/dL (ref 12.0–15.0)
Lymphocytes Relative: 29.5 % (ref 12.0–46.0)
Lymphs Abs: 1.5 10*3/uL (ref 0.7–4.0)
MCHC: 31.7 g/dL (ref 30.0–36.0)
MCV: 75.3 fl — ABNORMAL LOW (ref 78.0–100.0)
Monocytes Absolute: 0.3 10*3/uL (ref 0.1–1.0)
Monocytes Relative: 6.2 % (ref 3.0–12.0)
Neutro Abs: 3.2 10*3/uL (ref 1.4–7.7)
Neutrophils Relative %: 62.1 % (ref 43.0–77.0)
Platelets: 365 10*3/uL (ref 150.0–400.0)
RBC: 5.31 Mil/uL — ABNORMAL HIGH (ref 3.87–5.11)
RDW: 17.3 % — ABNORMAL HIGH (ref 11.5–15.5)
WBC: 5.1 10*3/uL (ref 4.0–10.5)

## 2018-07-28 LAB — IBC PANEL
Iron: 40 ug/dL — ABNORMAL LOW (ref 42–145)
Saturation Ratios: 12.1 % — ABNORMAL LOW (ref 20.0–50.0)
Transferrin: 236 mg/dL (ref 212.0–360.0)

## 2018-07-28 LAB — FERRITIN: Ferritin: 20.1 ng/mL (ref 10.0–291.0)

## 2018-07-28 LAB — FOLATE: Folate: 19.8 ng/mL (ref >5.9)

## 2018-07-28 LAB — VITAMIN B12: Vitamin B-12: 386 pg/mL (ref 211–911)

## 2018-07-28 LAB — VITAMIN D 25 HYDROXY (VIT D DEFICIENCY, FRACTURES): VITD: 35.89 ng/mL (ref 30.00–100.00)

## 2018-07-28 MED ORDER — FUSION PLUS PO CAPS: 1.0000 | ORAL_CAPSULE | Freq: Every day | ORAL | 1 refills | Status: DC

## 2018-07-28 MED ORDER — POTASSIUM CHLORIDE CRYS ER 20 MEQ PO TBCR: 20.0000 meq | EXTENDED_RELEASE_TABLET | Freq: Two times a day (BID) | ORAL | 1 refills | Status: DC

## 2018-07-30 ENCOUNTER — Encounter: Payer: Self-pay | Admitting: Internal Medicine

## 2018-09-03 ENCOUNTER — Other Ambulatory Visit: Payer: Self-pay | Admitting: Internal Medicine

## 2018-09-03 DIAGNOSIS — Z1231 Encounter for screening mammogram for malignant neoplasm of breast: Secondary | ICD-10-CM

## 2018-09-06 ENCOUNTER — Ambulatory Visit
Admission: RE | Admit: 2018-09-06 | Discharge: 2018-09-06 | Disposition: A | Discharge status: Home / Self Care | Payer: 59 | Source: Ambulatory Visit

## 2018-09-06 ENCOUNTER — Other Ambulatory Visit: Payer: Self-pay

## 2018-09-06 DIAGNOSIS — Z1231 Encounter for screening mammogram for malignant neoplasm of breast: Secondary | ICD-10-CM

## 2018-09-07 LAB — HM MAMMOGRAPHY

## 2018-10-11 ENCOUNTER — Other Ambulatory Visit: Payer: Self-pay | Admitting: Internal Medicine

## 2018-10-11 DIAGNOSIS — E559 Vitamin D deficiency, unspecified: Secondary | ICD-10-CM

## 2018-11-19 ENCOUNTER — Other Ambulatory Visit: Payer: Self-pay | Admitting: Internal Medicine

## 2018-11-19 DIAGNOSIS — I10 Essential (primary) hypertension: Secondary | ICD-10-CM

## 2018-11-19 DIAGNOSIS — I7300 Raynaud's syndrome without gangrene: Secondary | ICD-10-CM

## 2018-11-22 ENCOUNTER — Other Ambulatory Visit: Payer: Self-pay | Admitting: Internal Medicine

## 2018-11-22 DIAGNOSIS — I10 Essential (primary) hypertension: Secondary | ICD-10-CM

## 2018-11-22 DIAGNOSIS — I7300 Raynaud's syndrome without gangrene: Secondary | ICD-10-CM

## 2018-11-22 MED ORDER — NIFEDIPINE ER OSMOTIC RELEASE 30 MG PO TB24: 30.0000 mg | ORAL_TABLET | Freq: Every day | ORAL | 3 refills | Status: DC

## 2018-11-22 NOTE — Telephone Encounter (Signed)
Rf for Procardia was denied due to pt not having an appointment scheduled for Nov.   Pt has scheduled for December, are you okay sending in enough refills for pt.

## 2018-12-28 ENCOUNTER — Ambulatory Visit: Payer: 59 | Admitting: Internal Medicine

## 2018-12-28 ENCOUNTER — Telehealth: Payer: Self-pay

## 2018-12-28 NOTE — Telephone Encounter (Signed)
Pt contacted regarding the appointment. Informed pt that she will need to be seen in office. Pt was upset about needing to come in. Pt stated that she quit her job to be home with her kids during the pandemic.   Patient handed the phone to her husband [place] Carbo. He stated that pt was very upset and that she does not go anywhere. I explained the need for in person visit so that we are treating her chronic illnesses appropriately and not causing more harm than good.

## 2018-12-28 NOTE — Telephone Encounter (Signed)
Spoke to Dr. Ronnald Ramp and he wants to see patient in office for BP evaluation.    Called pt on mobile number and left voicemail message with details.

## 2019-03-26 ENCOUNTER — Other Ambulatory Visit: Payer: Self-pay | Admitting: Internal Medicine

## 2019-03-26 DIAGNOSIS — I10 Essential (primary) hypertension: Secondary | ICD-10-CM

## 2019-03-26 DIAGNOSIS — I7300 Raynaud's syndrome without gangrene: Secondary | ICD-10-CM

## 2019-04-05 ENCOUNTER — Ambulatory Visit: Payer: Self-pay | Admitting: Internal Medicine

## 2019-04-28 ENCOUNTER — Encounter: Payer: Self-pay | Admitting: Internal Medicine

## 2019-04-28 ENCOUNTER — Other Ambulatory Visit: Payer: Self-pay

## 2019-04-28 ENCOUNTER — Ambulatory Visit: Payer: BC Managed Care – PPO | Admitting: Internal Medicine

## 2019-04-28 VITALS — BP 136/82 | HR 94 | Temp 98.2°F | Resp 16 | Ht 65.0 in | Wt 241.0 lb

## 2019-04-28 DIAGNOSIS — Z6841 Body Mass Index (BMI) 40.0 and over, adult: Secondary | ICD-10-CM | POA: Insufficient documentation

## 2019-04-28 DIAGNOSIS — D500 Iron deficiency anemia secondary to blood loss (chronic): Secondary | ICD-10-CM

## 2019-04-28 DIAGNOSIS — Z124 Encounter for screening for malignant neoplasm of cervix: Secondary | ICD-10-CM

## 2019-04-28 DIAGNOSIS — I10 Essential (primary) hypertension: Secondary | ICD-10-CM

## 2019-04-28 DIAGNOSIS — E559 Vitamin D deficiency, unspecified: Secondary | ICD-10-CM

## 2019-04-28 DIAGNOSIS — Z0000 Encounter for general adult medical examination without abnormal findings: Secondary | ICD-10-CM | POA: Diagnosis not present

## 2019-04-28 DIAGNOSIS — E876 Hypokalemia: Secondary | ICD-10-CM | POA: Diagnosis not present

## 2019-04-28 LAB — LIPID PANEL
Cholesterol: 183 mg/dL (ref 0–200)
HDL: 42.7 mg/dL (ref >39.00)
LDL Cholesterol: 125 mg/dL — ABNORMAL HIGH (ref 0–99)
NonHDL: 140.79
Total CHOL/HDL Ratio: 4
Triglycerides: 79 mg/dL (ref 0.0–149.0)
VLDL: 15.8 mg/dL (ref 0.0–40.0)

## 2019-04-28 LAB — CBC WITH DIFFERENTIAL/PLATELET
Basophils Absolute: 0 10*3/uL (ref 0.0–0.1)
Basophils Relative: 0.4 % (ref 0.0–3.0)
Eosinophils Absolute: 0 10*3/uL (ref 0.0–0.7)
Eosinophils Relative: 0.7 % (ref 0.0–5.0)
HCT: 39.3 % (ref 36.0–46.0)
Hemoglobin: 12.8 g/dL (ref 12.0–15.0)
Lymphocytes Relative: 20.3 % (ref 12.0–46.0)
Lymphs Abs: 1.3 10*3/uL (ref 0.7–4.0)
MCHC: 32.7 g/dL (ref 30.0–36.0)
MCV: 76.2 fl — ABNORMAL LOW (ref 78.0–100.0)
Monocytes Absolute: 0.4 10*3/uL (ref 0.1–1.0)
Monocytes Relative: 5.9 % (ref 3.0–12.0)
Neutro Abs: 4.6 10*3/uL (ref 1.4–7.7)
Neutrophils Relative %: 72.7 % (ref 43.0–77.0)
Platelets: 295 10*3/uL (ref 150.0–400.0)
RBC: 5.15 Mil/uL — ABNORMAL HIGH (ref 3.87–5.11)
RDW: 16.7 % — ABNORMAL HIGH (ref 11.5–15.5)
WBC: 6.3 10*3/uL (ref 4.0–10.5)

## 2019-04-28 LAB — BASIC METABOLIC PANEL
BUN: 10 mg/dL (ref 6–23)
CO2: 22 mEq/L (ref 19–32)
Calcium: 8.5 mg/dL (ref 8.4–10.5)
Chloride: 102 mEq/L (ref 96–112)
Creatinine, Ser: 0.41 mg/dL (ref 0.40–1.20)
GFR: 207.08 mL/min (ref >60.00)
Glucose, Bld: 91 mg/dL (ref 70–99)
Potassium: 3.2 mEq/L — ABNORMAL LOW (ref 3.5–5.1)
Sodium: 134 mEq/L — ABNORMAL LOW (ref 135–145)

## 2019-04-28 LAB — HEPATIC FUNCTION PANEL
ALT: 10 U/L (ref 0–35)
AST: 11 U/L (ref 0–37)
Albumin: 3.6 g/dL (ref 3.5–5.2)
Alkaline Phosphatase: 104 U/L (ref 39–117)
Bilirubin, Direct: 0 mg/dL (ref 0.0–0.3)
Total Bilirubin: 0.3 mg/dL (ref 0.2–1.2)
Total Protein: 7.1 g/dL (ref 6.0–8.3)

## 2019-04-28 LAB — IBC PANEL
Iron: 41 ug/dL — ABNORMAL LOW (ref 42–145)
Saturation Ratios: 14.4 % — ABNORMAL LOW (ref 20.0–50.0)
Transferrin: 204 mg/dL — ABNORMAL LOW (ref 212.0–360.0)

## 2019-04-28 LAB — TSH: TSH: 3.2 u[IU]/mL (ref 0.35–4.50)

## 2019-04-28 LAB — MAGNESIUM: Magnesium: 1.8 mg/dL (ref 1.5–2.5)

## 2019-04-28 LAB — VITAMIN D 25 HYDROXY (VIT D DEFICIENCY, FRACTURES): VITD: 28.26 ng/mL — ABNORMAL LOW (ref 30.00–100.00)

## 2019-04-28 MED ORDER — POTASSIUM CHLORIDE CRYS ER 20 MEQ PO TBCR: 20.0000 meq | EXTENDED_RELEASE_TABLET | Freq: Two times a day (BID) | ORAL | 1 refills | Status: DC

## 2019-04-28 MED ORDER — FUSION PLUS PO CAPS: 2.0000 | ORAL_CAPSULE | Freq: Every day | ORAL | 1 refills | Status: DC

## 2019-04-28 MED ORDER — CHOLECALCIFEROL 50 MCG (2000 UT) PO TABS: 1.0000 | ORAL_TABLET | Freq: Every day | ORAL | 1 refills | Status: DC

## 2019-04-28 NOTE — Patient Instructions (Signed)
Health Maintenance, Female Adopting a healthy lifestyle and getting preventive care are important in promoting health and wellness. Ask your health care provider about:  The right schedule for you to have regular tests and exams.  Things you can do on your own to prevent diseases and keep yourself healthy. What should I know about diet, weight, and exercise? Eat a healthy diet   Eat a diet that includes plenty of vegetables, fruits, low-fat dairy products, and lean protein.  Do not eat a lot of foods that are high in solid fats, added sugars, or sodium. Maintain a healthy weight Body mass index (BMI) is used to identify weight problems. It estimates body fat based on height and weight. Your health care provider can help determine your BMI and help you achieve or maintain a healthy weight. Get regular exercise Get regular exercise. This is one of the most important things you can do for your health. Most adults should:  Exercise for at least 150 minutes each week. The exercise should increase your heart rate and make you sweat (moderate-intensity exercise).  Do strengthening exercises at least twice a week. This is in addition to the moderate-intensity exercise.  Spend less time sitting. Even light physical activity can be beneficial. Watch cholesterol and blood lipids Have your blood tested for lipids and cholesterol at 41 years of age, then have this test every 5 years. Have your cholesterol levels checked more often if:  Your lipid or cholesterol levels are high.  You are older than 40 years of age.  You are at high risk for heart disease. What should I know about cancer screening? Depending on your health history and family history, you may need to have cancer screening at various ages. This may include screening for:  Breast cancer.  Cervical cancer.  Colorectal cancer.  Skin cancer.  Lung cancer. What should I know about heart disease, diabetes, and high blood  pressure? Blood pressure and heart disease  High blood pressure causes heart disease and increases the risk of stroke. This is more likely to develop in people who have high blood pressure readings, are of African descent, or are overweight.  Have your blood pressure checked: ? Every 3-5 years if you are 18-39 years of age. ? Every year if you are 40 years old or older. Diabetes Have regular diabetes screenings. This checks your fasting blood sugar level. Have the screening done:  Once every three years after age 40 if you are at a normal weight and have a low risk for diabetes.  More often and at a younger age if you are overweight or have a high risk for diabetes. What should I know about preventing infection? Hepatitis B If you have a higher risk for hepatitis B, you should be screened for this virus. Talk with your health care provider to find out if you are at risk for hepatitis B infection. Hepatitis C Testing is recommended for:  Everyone born from 1945 through 1965.  Anyone with known risk factors for hepatitis C. Sexually transmitted infections (STIs)  Get screened for STIs, including gonorrhea and chlamydia, if: ? You are sexually active and are younger than 41 years of age. ? You are older than 41 years of age and your health care provider tells you that you are at risk for this type of infection. ? Your sexual activity has changed since you were last screened, and you are at increased risk for chlamydia or gonorrhea. Ask your health care provider if   you are at risk.  Ask your health care provider about whether you are at high risk for HIV. Your health care provider may recommend a prescription medicine to help prevent HIV infection. If you choose to take medicine to prevent HIV, you should first get tested for HIV. You should then be tested every 3 months for as long as you are taking the medicine. Pregnancy  If you are about to stop having your period (premenopausal) and  you may become pregnant, seek counseling before you get pregnant.  Take 400 to 800 micrograms (mcg) of folic acid every day if you become pregnant.  Ask for birth control (contraception) if you want to prevent pregnancy. Osteoporosis and menopause Osteoporosis is a disease in which the bones lose minerals and strength with aging. This can result in bone fractures. If you are 65 years old or older, or if you are at risk for osteoporosis and fractures, ask your health care provider if you should:  Be screened for bone loss.  Take a calcium or vitamin D supplement to lower your risk of fractures.  Be given hormone replacement therapy (HRT) to treat symptoms of menopause. Follow these instructions at home: Lifestyle  Do not use any products that contain nicotine or tobacco, such as cigarettes, e-cigarettes, and chewing tobacco. If you need help quitting, ask your health care provider.  Do not use street drugs.  Do not share needles.  Ask your health care provider for help if you need support or information about quitting drugs. Alcohol use  Do not drink alcohol if: ? Your health care provider tells you not to drink. ? You are pregnant, may be pregnant, or are planning to become pregnant.  If you drink alcohol: ? Limit how much you use to 0-1 drink a day. ? Limit intake if you are breastfeeding.  Be aware of how much alcohol is in your drink. In the [place] one drink equals one 12 oz bottle of beer (355 mL), one 5 oz glass of wine (148 mL), or one 1 oz glass of hard liquor (44 mL). General instructions  Schedule regular health, dental, and eye exams.  Stay current with your vaccines.  Tell your health care provider if: ? You often feel depressed. ? You have ever been abused or do not feel safe at home. Summary  Adopting a healthy lifestyle and getting preventive care are important in promoting health and wellness.  Follow your health care provider's instructions about healthy  diet, exercising, and getting tested or screened for diseases.  Follow your health care provider's instructions on monitoring your cholesterol and blood pressure. This information is not intended to replace advice given to you by your health care provider. Make sure you discuss any questions you have with your health care provider. Document Revised: 01/06/2018 Document Reviewed: 01/06/2018 Elsevier Patient Education  2020 [employer]  

## 2019-04-28 NOTE — Progress Notes (Signed)
Subjective:  Patient ID: Caroline Jones, female    DOB: 1980/08/14  Age: 39 y.o. MRN: [id number]  CC: Anemia, Hypertension, and Annual Exam   This visit occurred during the SARS-CoV-2 public health emergency.  Safety protocols were in place, including screening questions prior to the visit, additional usage of staff PPE, and extensive cleaning of exam room while observing appropriate contact time as indicated for disinfecting solutions.    HPI Caroline Jones presents for a CPX.  She complains of weight gain even though she has been working on her lifestyle modifications.  She frequently misses meals and sometimes consumes 1 meal a day.  She tells me her blood pressure has been well controlled.  She offers no other complaints.  Outpatient Medications Prior to Visit  Medication Sig Dispense Refill  . NIFEdipine (PROCARDIA-XL/NIFEDICAL-XL) 30 MG 24 hr tablet TAKE 1 TABLET BY MOUTH EVERY DAY 30 tablet 3  . Iron-FA-B Cmp-C-Biot-Probiotic (FUSION PLUS) CAPS Take 1 capsule by mouth daily. 90 capsule 1  . potassium chloride SA (K-DUR) 20 MEQ tablet Take 1 tablet (20 mEq total) by mouth 2 (two) times daily. 180 tablet 1  . Cholecalciferol (VITAMIN D3) 1.25 MG (50000 UT) CAPS TAKE ONE CAPSULE BY MOUTH ONE TIME PER WEEK 4 capsule 5  . RESTASIS 0.05 % ophthalmic emulsion      No facility-administered medications prior to visit.    ROS Review of Systems  Constitutional: Positive for unexpected weight change (wt gain). Negative for appetite change, chills, diaphoresis and fatigue.  HENT: Negative.   Eyes: Negative.   Respiratory: Negative for cough, chest tightness, shortness of breath and wheezing.   Cardiovascular: Negative for chest pain and leg swelling.  Gastrointestinal: Negative for abdominal pain, constipation, diarrhea, nausea and vomiting.  Endocrine: Negative.   Genitourinary: Negative.  Negative for difficulty urinating, dysuria and urgency.  Musculoskeletal: Negative.    Skin: Negative.  Negative for color change and pallor.  Neurological: Negative.  Negative for dizziness, weakness and light-headedness.  Hematological: Negative for adenopathy. Does not bruise/bleed easily.  Psychiatric/Behavioral: Negative.     Objective:  BP 136/82 (BP Location: Left Arm, Patient Position: Sitting, Cuff Size: Large)   Pulse 94   Temp 98.2 F (36.8 C) (Oral)   Resp 16   Ht 5\' 5"  (1.651 m)   Wt 241 lb (109.3 kg)   LMP 04/06/2019   SpO2 98%   BMI 40.10 kg/m   BP Readings from Last 3 Encounters:  04/28/19 136/82  07/30/18 126/87  09/10/17 (!) 144/84    Wt Readings from Last 3 Encounters:  04/28/19 241 lb (109.3 kg)  07/30/18 229 lb (103.9 kg)  09/10/17 235 lb (106.6 kg)    Physical Exam Vitals reviewed.  Constitutional:      Appearance: She is obese.  HENT:     Nose: Nose normal.     Mouth/Throat:     Mouth: Mucous membranes are moist.  Eyes:     General: No scleral icterus.    Conjunctiva/sclera: Conjunctivae normal.  Cardiovascular:     Rate and Rhythm: Normal rate and regular rhythm.     Heart sounds: No murmur.  Pulmonary:     Effort: Pulmonary effort is normal.     Breath sounds: No stridor. No wheezing, rhonchi or rales.  Abdominal:     General: Abdomen is protuberant. Bowel sounds are normal. There is no distension.     Palpations: Abdomen is soft. There is no hepatomegaly, splenomegaly or mass.  Tenderness: There is no abdominal tenderness.  Musculoskeletal:        General: Normal range of motion.     Cervical back: Neck supple.     Right lower leg: No edema.     Left lower leg: No edema.  Lymphadenopathy:     Cervical: No cervical adenopathy.  Skin:    General: Skin is warm and dry.     Coloration: Skin is not pale.  Neurological:     General: No focal deficit present.     Mental Status: She is alert and oriented to person, place, and time. Mental status is at baseline.  Psychiatric:        Mood and Affect: Mood normal.         Behavior: Behavior normal.        Thought Content: Thought content normal.        Judgment: Judgment normal.     Lab Results  Component Value Date   WBC 6.3 04/28/2019   HGB 12.8 04/28/2019   HCT 39.3 04/28/2019   PLT 295.0 04/28/2019   GLUCOSE 91 04/28/2019   CHOL 183 04/28/2019   TRIG 79.0 04/28/2019   HDL 42.70 04/28/2019   LDLCALC 125 (H) 04/28/2019   ALT 10 04/28/2019   AST 11 04/28/2019   NA 134 (L) 04/28/2019   K 3.2 (L) 04/28/2019   CL 102 04/28/2019   CREATININE 0.41 04/28/2019   BUN 10 04/28/2019   CO2 22 04/28/2019   TSH 3.20 04/28/2019   HGBA1C 5.7 01/09/2016    MM 3D SCREEN BREAST BILATERAL  Result Date: 09/07/2018 CLINICAL DATA:  Screening. EXAM: DIGITAL SCREENING BILATERAL MAMMOGRAM WITH TOMO AND CAD COMPARISON:  None. ACR Breast Density Category b: There are scattered areas of fibroglandular density. FINDINGS: There are no findings suspicious for malignancy. Images were processed with CAD. IMPRESSION: No mammographic evidence of malignancy. A result letter of this screening mammogram will be mailed directly to the patient. RECOMMENDATION: Screening mammogram in one year. (Code:SM-B-01Y) BI-RADS CATEGORY  1: Negative. Electronically Signed   By: Sherian Rein M.D.   On: 09/07/2018 09:59    Assessment & Plan:   Janaysha was seen today for anemia, hypertension and annual exam.  Diagnoses and all orders for this visit:  Chronic hypokalemia- I have asked her to start taking a potassium supplement again. -     Basic metabolic panel; Future -     Magnesium; Future -     Magnesium -     Basic metabolic panel -     potassium chloride SA (KLOR-CON) 20 MEQ tablet; Take 1 tablet (20 mEq total) by mouth 2 (two) times daily.  Essential hypertension, benign- Her blood pressure is adequately well controlled. -     Basic metabolic panel; Future -     Hepatic function panel; Future -     TSH; Future -     TSH -     Hepatic function panel -     Basic metabolic  panel  Vitamin D deficiency disease -     VITAMIN D 25 Hydroxy (Vit-D Deficiency, Fractures); Future -     VITAMIN D 25 Hydroxy (Vit-D Deficiency, Fractures) -     Cholecalciferol 50 MCG (2000 UT) TABS; Take 1 tablet (2,000 Units total) by mouth daily.  Routine general medical examination at a health care facility- Exam completed, labs reviewed, vaccines reviewed and updated, breast cancer screening is up-to-date, she is referred for cervical cancer screening, patient education was given. -  Lipid panel; Future -     HIV Antibody (routine testing w rflx); Future -     Lipid panel -     HIV Antibody (routine testing w rflx)  Iron deficiency anemia due to chronic blood loss- She is not anemic but her iron level is low.  I have asked her to double the dose of her iron supplement. -     IBC panel; Future -     CBC with Differential/Platelet; Future -     CBC with Differential/Platelet -     IBC panel -     Iron-FA-B Cmp-C-Biot-Probiotic (FUSION PLUS) CAPS; Take 2 capsules by mouth daily.  Cervical cancer screening -     Ambulatory referral to Gynecology  Class 3 severe obesity without serious comorbidity with body mass index (BMI) of 40.0 to 44.9 in adult, unspecified obesity type (HCC)- She was praised for her lifestyle modifications but I have asked her to try to consume at least 3-4 small high-protein meals a day.   I have discontinued Patte M. Delaurentis's Restasis and Vitamin D3. I have also changed her potassium chloride SA and Fusion Plus. Additionally, I am having her start on Cholecalciferol. Lastly, I am having her maintain her NIFEdipine.  Meds ordered this encounter  Medications  . potassium chloride SA (KLOR-CON) 20 MEQ tablet    Sig: Take 1 tablet (20 mEq total) by mouth 2 (two) times daily.    Dispense:  180 tablet    Refill:  1  . Iron-FA-B Cmp-C-Biot-Probiotic (FUSION PLUS) CAPS    Sig: Take 2 capsules by mouth daily.    Dispense:  180 capsule    Refill:  1  .  Cholecalciferol 50 MCG (2000 UT) TABS    Sig: Take 1 tablet (2,000 Units total) by mouth daily.    Dispense:  90 tablet    Refill:  1   I spent 50 minutes in preparing to see the patient by review of recent labs, imaging and procedures, obtaining and reviewing separately obtained history, communicating with the patient and family or caregiver, ordering medications, tests or procedures, and documenting clinical information in the EHR including the differential Dx, treatment, and any further evaluation and other management of obesity, iron deficiency, hypertension, vitamin D deficiency, and hypokalemia.    Follow-up: Return in about 6 months (around 10/28/2019).  Sanda Linger, MD

## 2019-04-29 LAB — HIV ANTIBODY (ROUTINE TESTING W REFLEX): HIV 1&2 Ab, 4th Generation: NONREACTIVE

## 2019-07-31 ENCOUNTER — Other Ambulatory Visit: Payer: Self-pay | Admitting: Internal Medicine

## 2019-07-31 DIAGNOSIS — I10 Essential (primary) hypertension: Secondary | ICD-10-CM

## 2019-07-31 DIAGNOSIS — I7300 Raynaud's syndrome without gangrene: Secondary | ICD-10-CM

## 2019-08-24 ENCOUNTER — Ambulatory Visit: Payer: Self-pay | Admitting: Nurse Practitioner

## 2019-09-29 ENCOUNTER — Ambulatory Visit: Payer: Self-pay | Admitting: Nurse Practitioner

## 2019-12-04 ENCOUNTER — Other Ambulatory Visit: Payer: Self-pay | Admitting: Internal Medicine

## 2019-12-04 DIAGNOSIS — I10 Essential (primary) hypertension: Secondary | ICD-10-CM

## 2019-12-04 DIAGNOSIS — I7300 Raynaud's syndrome without gangrene: Secondary | ICD-10-CM

## 2019-12-04 DIAGNOSIS — E559 Vitamin D deficiency, unspecified: Secondary | ICD-10-CM

## 2020-05-28 ENCOUNTER — Other Ambulatory Visit: Payer: Self-pay | Admitting: Internal Medicine

## 2020-05-28 DIAGNOSIS — I10 Essential (primary) hypertension: Secondary | ICD-10-CM

## 2020-05-28 DIAGNOSIS — I7300 Raynaud's syndrome without gangrene: Secondary | ICD-10-CM

## 2020-06-20 ENCOUNTER — Encounter: Payer: BC Managed Care – PPO | Admitting: Internal Medicine

## 2020-07-13 ENCOUNTER — Other Ambulatory Visit: Payer: Self-pay

## 2020-07-16 ENCOUNTER — Encounter: Payer: Self-pay | Admitting: Internal Medicine

## 2020-07-16 ENCOUNTER — Other Ambulatory Visit: Payer: Self-pay

## 2020-07-16 ENCOUNTER — Ambulatory Visit (INDEPENDENT_AMBULATORY_CARE_PROVIDER_SITE_OTHER): Payer: BC Managed Care – PPO | Admitting: Internal Medicine

## 2020-07-16 VITALS — BP 166/94 | HR 97 | Temp 99.2°F | Ht 65.0 in | Wt 241.0 lb

## 2020-07-16 DIAGNOSIS — D513 Other dietary vitamin B12 deficiency anemia: Secondary | ICD-10-CM

## 2020-07-16 DIAGNOSIS — I10 Essential (primary) hypertension: Secondary | ICD-10-CM | POA: Diagnosis not present

## 2020-07-16 DIAGNOSIS — R011 Cardiac murmur, unspecified: Secondary | ICD-10-CM | POA: Diagnosis not present

## 2020-07-16 DIAGNOSIS — E559 Vitamin D deficiency, unspecified: Secondary | ICD-10-CM

## 2020-07-16 DIAGNOSIS — E876 Hypokalemia: Secondary | ICD-10-CM | POA: Diagnosis not present

## 2020-07-16 DIAGNOSIS — D500 Iron deficiency anemia secondary to blood loss (chronic): Secondary | ICD-10-CM | POA: Diagnosis not present

## 2020-07-16 DIAGNOSIS — Z1159 Encounter for screening for other viral diseases: Secondary | ICD-10-CM | POA: Diagnosis not present

## 2020-07-16 DIAGNOSIS — Z124 Encounter for screening for malignant neoplasm of cervix: Secondary | ICD-10-CM

## 2020-07-16 DIAGNOSIS — E611 Iron deficiency: Secondary | ICD-10-CM

## 2020-07-16 DIAGNOSIS — Z0000 Encounter for general adult medical examination without abnormal findings: Secondary | ICD-10-CM

## 2020-07-16 DIAGNOSIS — Z23 Encounter for immunization: Secondary | ICD-10-CM

## 2020-07-16 LAB — MAGNESIUM: Magnesium: 2 mg/dL (ref 1.5–2.5)

## 2020-07-16 LAB — CBC WITH DIFFERENTIAL/PLATELET
Basophils Absolute: 0 10*3/uL (ref 0.0–0.1)
Basophils Relative: 0.9 % (ref 0.0–3.0)
Eosinophils Absolute: 0 10*3/uL (ref 0.0–0.7)
Eosinophils Relative: 0.8 % (ref 0.0–5.0)
HCT: 40.1 % (ref 36.0–46.0)
Hemoglobin: 13.1 g/dL (ref 12.0–15.0)
Lymphocytes Relative: 26.2 % (ref 12.0–46.0)
Lymphs Abs: 1.3 10*3/uL (ref 0.7–4.0)
MCHC: 32.6 g/dL (ref 30.0–36.0)
MCV: 77.3 fl — ABNORMAL LOW (ref 78.0–100.0)
Monocytes Absolute: 0.3 10*3/uL (ref 0.1–1.0)
Monocytes Relative: 6.4 % (ref 3.0–12.0)
Neutro Abs: 3.1 10*3/uL (ref 1.4–7.7)
Neutrophils Relative %: 65.7 % (ref 43.0–77.0)
Platelets: 336 10*3/uL (ref 150.0–400.0)
RBC: 5.19 Mil/uL — ABNORMAL HIGH (ref 3.87–5.11)
RDW: 16.5 % — ABNORMAL HIGH (ref 11.5–15.5)
WBC: 4.8 10*3/uL (ref 4.0–10.5)

## 2020-07-16 LAB — BASIC METABOLIC PANEL
BUN: 8 mg/dL (ref 6–23)
CO2: 28 mEq/L (ref 19–32)
Calcium: 8.8 mg/dL (ref 8.4–10.5)
Chloride: 103 mEq/L (ref 96–112)
Creatinine, Ser: 0.42 mg/dL (ref 0.40–1.20)
GFR: 121 mL/min (ref >60.00)
Glucose, Bld: 92 mg/dL (ref 70–99)
Potassium: 3.5 mEq/L (ref 3.5–5.1)
Sodium: 139 mEq/L (ref 135–145)

## 2020-07-16 LAB — HEPATIC FUNCTION PANEL
ALT: 15 U/L (ref 0–35)
AST: 13 U/L (ref 0–37)
Albumin: 3.9 g/dL (ref 3.5–5.2)
Alkaline Phosphatase: 100 U/L (ref 39–117)
Bilirubin, Direct: 0 mg/dL (ref 0.0–0.3)
Total Bilirubin: 0.3 mg/dL (ref 0.2–1.2)
Total Protein: 7.5 g/dL (ref 6.0–8.3)

## 2020-07-16 LAB — LIPID PANEL
Cholesterol: 202 mg/dL — ABNORMAL HIGH (ref 0–200)
HDL: 47.6 mg/dL (ref >39.00)
LDL Cholesterol: 141 mg/dL — ABNORMAL HIGH (ref 0–99)
NonHDL: 154.2
Total CHOL/HDL Ratio: 4
Triglycerides: 65 mg/dL (ref 0.0–149.0)
VLDL: 13 mg/dL (ref 0.0–40.0)

## 2020-07-16 LAB — URINALYSIS, ROUTINE W REFLEX MICROSCOPIC
Bilirubin Urine: NEGATIVE
Hgb urine dipstick: NEGATIVE
Ketones, ur: NEGATIVE
Leukocytes,Ua: NEGATIVE
Nitrite: NEGATIVE
Specific Gravity, Urine: 1.02 (ref 1.000–1.030)
Urine Glucose: NEGATIVE
Urobilinogen, UA: 0.2 (ref 0.0–1.0)
pH: 6.5 (ref 5.0–8.0)

## 2020-07-16 LAB — IRON: Iron: 39 ug/dL — ABNORMAL LOW (ref 42–145)

## 2020-07-16 LAB — VITAMIN D 25 HYDROXY (VIT D DEFICIENCY, FRACTURES): VITD: 25.86 ng/mL — ABNORMAL LOW (ref 30.00–100.00)

## 2020-07-16 LAB — FERRITIN: Ferritin: 25.9 ng/mL (ref 10.0–291.0)

## 2020-07-16 LAB — TSH: TSH: 3.22 u[IU]/mL (ref 0.35–4.50)

## 2020-07-16 LAB — FOLATE: Folate: >24.4 ng/mL (ref >5.9)

## 2020-07-16 LAB — VITAMIN B12: Vitamin B-12: 293 pg/mL (ref 211–911)

## 2020-07-16 MED ORDER — ACCRUFER 30 MG PO CAPS: 1.0000 | ORAL_CAPSULE | Freq: Two times a day (BID) | ORAL | 1 refills | Status: DC

## 2020-07-16 MED ORDER — TRIAMTERENE-HCTZ 37.5-25 MG PO CAPS: 1.0000 | ORAL_CAPSULE | Freq: Every day | ORAL | 0 refills | Status: DC

## 2020-07-16 NOTE — Patient Instructions (Signed)

## 2020-07-16 NOTE — Progress Notes (Signed)
Subjective:  Patient ID: Caroline Jones, female    DOB: 1980-09-21  Age: 40 y.o. MRN: [id number]  CC: Annual Exam, Hypertension, and Anemia  This visit occurred during the SARS-CoV-2 public health emergency.  Safety protocols were in place, including screening questions prior to the visit, additional usage of staff PPE, and extensive cleaning of exam room while observing appropriate contact time as indicated for disinfecting solutions.    HPI CHAN ROSASCO presents for a CPX and f/up -   She complains that nifedipine is not controlling her blood pressure and makes her feel puffy.  She is very active.  She uses a push mower to cut her grass and does not experience CP, DOE, diaphoresis, dizziness, lightheadedness, or fatigue.  Outpatient Medications Prior to Visit  Medication Sig Dispense Refill   Cholecalciferol (VITAMIN D3) 50 MCG (2000 UT) TABS TAKE 1 TABLET BY MOUTH EVERY DAY 90 tablet 1   Iron-FA-B Cmp-C-Biot-Probiotic (FUSION PLUS) CAPS Take 2 capsules by mouth daily. 180 capsule 1   NIFEdipine (PROCARDIA-XL/NIFEDICAL-XL) 30 MG 24 hr tablet TAKE 1 TABLET BY MOUTH EVERY DAY 90 tablet 1   potassium chloride SA (KLOR-CON) 20 MEQ tablet Take 1 tablet (20 mEq total) by mouth 2 (two) times daily. 180 tablet 1   No facility-administered medications prior to visit.    ROS Review of Systems  Constitutional:  Negative for appetite change, diaphoresis, fatigue and unexpected weight change.  HENT: Negative.    Eyes: Negative.   Respiratory:  Negative for apnea, cough, shortness of breath and wheezing.   Cardiovascular:  Negative for chest pain, palpitations and leg swelling.  Gastrointestinal:  Negative for abdominal pain, constipation, diarrhea, nausea and vomiting.  Endocrine: Negative.   Genitourinary: Negative.  Negative for difficulty urinating.  Musculoskeletal: Negative.  Negative for arthralgias.  Skin: Negative.   Neurological: Negative.  Negative for dizziness, weakness  and light-headedness.  Hematological:  Negative for adenopathy. Does not bruise/bleed easily.  Psychiatric/Behavioral: Negative.     Objective:  BP (!) 166/94 (BP Location: Left Arm, Patient Position: Sitting, Cuff Size: Large)   Pulse 97   Temp 99.2 F (37.3 C) (Oral)   Ht 5\' 5"  (1.651 m)   Wt 241 lb (109.3 kg)   SpO2 99%   BMI 40.10 kg/m   BP Readings from Last 3 Encounters:  07/16/20 (!) 166/94  04/28/19 136/82  07/30/18 126/87    Wt Readings from Last 3 Encounters:  07/16/20 241 lb (109.3 kg)  04/28/19 241 lb (109.3 kg)  07/30/18 229 lb (103.9 kg)    Physical Exam Vitals reviewed.  Constitutional:      Appearance: She is obese.  HENT:     Nose: Nose normal.     Mouth/Throat:     Mouth: Mucous membranes are moist.  Eyes:     General: No scleral icterus.    Conjunctiva/sclera: Conjunctivae normal.  Cardiovascular:     Rate and Rhythm: Normal rate and regular rhythm.     Heart sounds: S1 normal and S2 normal. Murmur heard.  Systolic murmur is present with a grade of 2/6.  No diastolic murmur is present.    No gallop.     Comments: EKG- NSR, 70 bpm Normal EKG Pulmonary:     Breath sounds: No stridor. No wheezing, rhonchi or rales.  Abdominal:     General: Abdomen is protuberant. Bowel sounds are normal. There is no distension.     Palpations: Abdomen is soft. There is no hepatomegaly, splenomegaly or mass.  Tenderness: There is no abdominal tenderness.  Musculoskeletal:     Cervical back: Neck supple.     Right lower leg: No edema.     Left lower leg: No edema.  Skin:    General: Skin is warm and dry.     Coloration: Skin is not pale.  Neurological:     General: No focal deficit present.     Mental Status: She is alert and oriented to person, place, and time. Mental status is at baseline.  Psychiatric:        Mood and Affect: Mood normal.        Behavior: Behavior normal.    Lab Results  Component Value Date   WBC 4.8 07/16/2020   HGB 13.1  07/16/2020   HCT 40.1 07/16/2020   PLT 336.0 07/16/2020   GLUCOSE 92 07/16/2020   CHOL 202 (H) 07/16/2020   TRIG 65.0 07/16/2020   HDL 47.60 07/16/2020   LDLCALC 141 (H) 07/16/2020   ALT 15 07/16/2020   AST 13 07/16/2020   NA 139 07/16/2020   K 3.5 07/16/2020   CL 103 07/16/2020   CREATININE 0.42 07/16/2020   BUN 8 07/16/2020   CO2 28 07/16/2020   TSH 3.22 07/16/2020   HGBA1C 5.7 01/09/2016    MM 3D SCREEN BREAST BILATERAL  Result Date: 09/07/2018 CLINICAL DATA:  Screening. EXAM: DIGITAL SCREENING BILATERAL MAMMOGRAM WITH TOMO AND CAD COMPARISON:  None. ACR Breast Density Category b: There are scattered areas of fibroglandular density. FINDINGS: There are no findings suspicious for malignancy. Images were processed with CAD. IMPRESSION: No mammographic evidence of malignancy. A result letter of this screening mammogram will be mailed directly to the patient. RECOMMENDATION: Screening mammogram in one year. (Code:SM-B-01Y) BI-RADS CATEGORY  1: Negative. Electronically Signed   By: Sherian Rein M.D.   On: 09/07/2018 09:59    Assessment & Plan:   Malini was seen today for annual exam, hypertension and anemia.  Diagnoses and all orders for this visit:  Essential hypertension, benign- Her blood pressure is not adequately well controlled and she has a mildly low potassium level.  She will continue to work on her lifestyle modifications.  I recommended that she start taking the combination of triamterene and hydrochlorothiazide. -     Magnesium; Future -     TSH; Future -     Urinalysis, Routine w reflex microscopic; Future -     Hepatic function panel; Future -     EKG 12-Lead -     Hepatic function panel -     Urinalysis, Routine w reflex microscopic -     TSH -     Magnesium -     triamterene-hydrochlorothiazide (DYAZIDE) 37.5-25 MG capsule; Take 1 each (1 capsule total) by mouth daily.  Iron deficiency anemia due to chronic blood loss- Her H&H are normal now. -     CBC with  Differential/Platelet; Future -     Ferritin; Future -     Iron; Future -     Iron -     Ferritin -     CBC with Differential/Platelet  Chronic hypokalemia -     Basic metabolic panel; Future -     Basic metabolic panel -     triamterene-hydrochlorothiazide (DYAZIDE) 37.5-25 MG capsule; Take 1 each (1 capsule total) by mouth daily.  Other dietary vitamin B12 deficiency anemia- Her H&H, B12, and folate levels are normal. -     Folate; Future -     Vitamin  B12; Future -     Vitamin B12 -     Folate  Routine general medical examination at a health care facility- Exam completed, labs reviewed, vaccines reviewed and updated, cancer screenings addressed, patient education was given. -     Lipid panel; Future -     Lipid panel  Vitamin D deficiency disease -     VITAMIN D 25 Hydroxy (Vit-D Deficiency, Fractures); Future -     VITAMIN D 25 Hydroxy (Vit-D Deficiency, Fractures) -     Cholecalciferol (VITAMIN D3) 50 MCG (2000 UT) TABS; Take 1 tablet by mouth daily.  Need for hepatitis C screening test -     Hepatitis C antibody; Future -     Hepatitis C antibody  Systolic murmur- Will screen for valvular heart disease. -     EKG 12-Lead -     ECHOCARDIOGRAM COMPLETE; Future  Need for vaccination -     Tdap vaccine greater than or equal to 7yo IM  Iron deficiency- Her iron level is low.  I recommended that she start taking an iron supplement. -     Ferric Maltol (ACCRUFER) 30 MG CAPS; Take 1 capsule by mouth in the morning and at bedtime.  I have discontinued Kinleigh M. Wilbon's potassium chloride SA, Fusion Plus, and NIFEdipine. I have also changed her Vitamin D3. Additionally, I am having her start on ACCRUFeR and triamterene-hydrochlorothiazide.  Meds ordered this encounter  Medications   Ferric Maltol (ACCRUFER) 30 MG CAPS    Sig: Take 1 capsule by mouth in the morning and at bedtime.    Dispense:  180 capsule    Refill:  1   triamterene-hydrochlorothiazide (DYAZIDE)  37.5-25 MG capsule    Sig: Take 1 each (1 capsule total) by mouth daily.    Dispense:  90 capsule    Refill:  0   Cholecalciferol (VITAMIN D3) 50 MCG (2000 UT) TABS    Sig: Take 1 tablet by mouth daily.    Dispense:  90 tablet    Refill:  1      Follow-up: Return in about 3 months (around 10/16/2020).  Sanda Linger, MD

## 2020-07-17 ENCOUNTER — Encounter: Payer: Self-pay | Admitting: Internal Medicine

## 2020-07-17 LAB — HEPATITIS C ANTIBODY
Hepatitis C Ab: NONREACTIVE
SIGNAL TO CUT-OFF: 0 (ref <1.00)

## 2020-07-17 MED ORDER — VITAMIN D3 50 MCG (2000 UT) PO TABS: 1.0000 | ORAL_TABLET | Freq: Every day | ORAL | 1 refills | Status: DC

## 2020-07-18 ENCOUNTER — Encounter: Payer: Self-pay | Admitting: Internal Medicine

## 2020-07-24 ENCOUNTER — Telehealth: Payer: Self-pay | Admitting: Internal Medicine

## 2020-07-24 NOTE — Telephone Encounter (Signed)
Key: [id number]

## 2020-07-24 NOTE — Telephone Encounter (Signed)
Per CoverMyMeds: ? ?PA was denied.  ?

## 2020-07-24 NOTE — Telephone Encounter (Signed)
Calling to check on the status of PA of [employer]. Sent through covermymeds.   Please advise.   Callback #: [phone]

## 2020-09-25 ENCOUNTER — Encounter: Payer: Self-pay | Admitting: Nurse Practitioner

## 2020-10-12 ENCOUNTER — Telehealth: Payer: Self-pay | Admitting: Internal Medicine

## 2020-10-12 DIAGNOSIS — E876 Hypokalemia: Secondary | ICD-10-CM

## 2020-10-12 DIAGNOSIS — I10 Essential (primary) hypertension: Secondary | ICD-10-CM

## 2020-10-12 NOTE — Telephone Encounter (Signed)
1.Medication Requested: triamterene-hydrochlorothiazide (DYAZIDE) 37.5-25 MG capsule   2. Pharmacy (Name, Street, [place]): [facility] [place], [place] - [address] [facility]  Phone:  [phone] Fax:  [phone]   3. On Med List: yes  4. Last Visit with PCP: 06.20.22  5. Next visit date with PCP: 10.26.22   Agent: Please be advised that RX refills may take up to 3 business days. We ask that you follow-up with your pharmacy.

## 2020-10-15 MED ORDER — TRIAMTERENE-HCTZ 37.5-25 MG PO CAPS: 1.0000 | ORAL_CAPSULE | Freq: Every day | ORAL | 0 refills | Status: DC

## 2020-10-16 ENCOUNTER — Ambulatory Visit: Payer: BC Managed Care – PPO | Admitting: Internal Medicine

## 2020-11-07 LAB — HM PAP SMEAR

## 2020-11-14 DIAGNOSIS — Z124 Encounter for screening for malignant neoplasm of cervix: Secondary | ICD-10-CM | POA: Diagnosis not present

## 2020-11-14 DIAGNOSIS — Z01419 Encounter for gynecological examination (general) (routine) without abnormal findings: Secondary | ICD-10-CM | POA: Diagnosis not present

## 2020-11-15 ENCOUNTER — Encounter: Payer: Self-pay | Admitting: Internal Medicine

## 2020-11-21 ENCOUNTER — Ambulatory Visit: Payer: BC Managed Care – PPO | Admitting: Internal Medicine

## 2020-12-10 ENCOUNTER — Ambulatory Visit: Payer: Self-pay | Admitting: Nurse Practitioner

## 2021-01-02 ENCOUNTER — Other Ambulatory Visit: Payer: Self-pay

## 2021-01-02 ENCOUNTER — Ambulatory Visit: Payer: BC Managed Care – PPO | Admitting: Internal Medicine

## 2021-01-02 ENCOUNTER — Encounter: Payer: Self-pay | Admitting: Internal Medicine

## 2021-01-02 VITALS — BP 136/84 | HR 89 | Temp 98.8°F | Resp 16 | Ht 65.0 in | Wt 229.0 lb

## 2021-01-02 DIAGNOSIS — D513 Other dietary vitamin B12 deficiency anemia: Secondary | ICD-10-CM | POA: Diagnosis not present

## 2021-01-02 DIAGNOSIS — Z1231 Encounter for screening mammogram for malignant neoplasm of breast: Secondary | ICD-10-CM | POA: Diagnosis not present

## 2021-01-02 DIAGNOSIS — I10 Essential (primary) hypertension: Secondary | ICD-10-CM

## 2021-01-02 DIAGNOSIS — E611 Iron deficiency: Secondary | ICD-10-CM

## 2021-01-02 DIAGNOSIS — E876 Hypokalemia: Secondary | ICD-10-CM

## 2021-01-02 LAB — IBC + FERRITIN
Ferritin: 26.6 ng/mL (ref 10.0–291.0)
Iron: 50 ug/dL (ref 42–145)
Saturation Ratios: 16.1 % — ABNORMAL LOW (ref 20.0–50.0)
TIBC: 310.8 ug/dL (ref 250.0–450.0)
Transferrin: 222 mg/dL (ref 212.0–360.0)

## 2021-01-02 LAB — BASIC METABOLIC PANEL
BUN: 15 mg/dL (ref 6–23)
CO2: 29 mEq/L (ref 19–32)
Calcium: 10.2 mg/dL (ref 8.4–10.5)
Chloride: 102 mEq/L (ref 96–112)
Creatinine, Ser: 0.56 mg/dL (ref 0.40–1.20)
GFR: 112.53 mL/min (ref >60.00)
Glucose, Bld: 93 mg/dL (ref 70–99)
Potassium: 3.2 mEq/L — ABNORMAL LOW (ref 3.5–5.1)
Sodium: 141 mEq/L (ref 135–145)

## 2021-01-02 LAB — CBC WITH DIFFERENTIAL/PLATELET
Basophils Absolute: 0 10*3/uL (ref 0.0–0.1)
Basophils Relative: 0.7 % (ref 0.0–3.0)
Eosinophils Absolute: 0.1 10*3/uL (ref 0.0–0.7)
Eosinophils Relative: 1.4 % (ref 0.0–5.0)
HCT: 38.6 % (ref 36.0–46.0)
Hemoglobin: 12.4 g/dL (ref 12.0–15.0)
Lymphocytes Relative: 27.6 % (ref 12.0–46.0)
Lymphs Abs: 1.3 10*3/uL (ref 0.7–4.0)
MCHC: 32.1 g/dL (ref 30.0–36.0)
MCV: 78.3 fl (ref 78.0–100.0)
Monocytes Absolute: 0.3 10*3/uL (ref 0.1–1.0)
Monocytes Relative: 6.9 % (ref 3.0–12.0)
Neutro Abs: 3 10*3/uL (ref 1.4–7.7)
Neutrophils Relative %: 63.4 % (ref 43.0–77.0)
Platelets: 299 10*3/uL (ref 150.0–400.0)
RBC: 4.93 Mil/uL (ref 3.87–5.11)
RDW: 15.4 % (ref 11.5–15.5)
WBC: 4.8 10*3/uL (ref 4.0–10.5)

## 2021-01-02 LAB — FOLATE: Folate: >23.4 ng/mL (ref >5.9)

## 2021-01-02 LAB — VITAMIN B12: Vitamin B-12: 332 pg/mL (ref 211–911)

## 2021-01-02 MED ORDER — POTASSIUM CHLORIDE ER 10 MEQ PO TBCR: 10.0000 meq | EXTENDED_RELEASE_TABLET | Freq: Two times a day (BID) | ORAL | 0 refills | Status: DC

## 2021-01-02 MED ORDER — TRIAMTERENE-HCTZ 37.5-25 MG PO CAPS
1.0000 | ORAL_CAPSULE | Freq: Every day | ORAL | 1 refills | Status: DC
Start: 2021-01-02 — End: 2021-07-21

## 2021-01-02 NOTE — Patient Instructions (Signed)

## 2021-01-02 NOTE — Progress Notes (Signed)
Subjective:  Patient ID: Caroline Jones, female    DOB: April 17, 1980  Age: 40 y.o. MRN: [id number]  CC: Hypertension  This visit occurred during the SARS-CoV-2 public health emergency.  Safety protocols were in place, including screening questions prior to the visit, additional usage of staff PPE, and extensive cleaning of exam room while observing appropriate contact time as indicated for disinfecting solutions.    HPI Caroline Jones presents for f/up -   Caroline Jones has lost weight with lifestyle modifications.  Caroline Jones denies headache, blurred vision, chest pain, shortness of breath, or edema.  Outpatient Medications Prior to Visit  Medication Sig Dispense Refill   Cholecalciferol (VITAMIN D3) 50 MCG (2000 UT) TABS Take 1 tablet by mouth daily. 90 tablet 1   Ferric Maltol (ACCRUFER) 30 MG CAPS Take 1 capsule by mouth in the morning and at bedtime. 180 capsule 1   triamterene-hydrochlorothiazide (DYAZIDE) 37.5-25 MG capsule Take 1 each (1 capsule total) by mouth daily. 90 capsule 0   No facility-administered medications prior to visit.    ROS Review of Systems  Constitutional:  Negative for diaphoresis and fatigue.  HENT: Negative.    Eyes: Negative.   Respiratory:  Negative for cough, chest tightness, shortness of breath and wheezing.   Cardiovascular:  Negative for chest pain, palpitations and leg swelling.  Gastrointestinal:  Negative for abdominal pain, constipation and diarrhea.  Endocrine: Negative.   Genitourinary: Negative.  Negative for difficulty urinating.  Musculoskeletal:  Negative for arthralgias.  Skin: Negative.  Negative for color change.  Neurological:  Negative for dizziness, weakness and light-headedness.  Hematological:  Negative for adenopathy. Does not bruise/bleed easily.  Psychiatric/Behavioral: Negative.     Objective:  BP 136/84 (BP Location: Left Arm, Patient Position: Sitting, Cuff Size: Large)   Pulse 89   Temp 98.8 F (37.1 C) (Oral)   Resp 16    Ht 5\' 5"  (1.651 m)   Wt 229 lb (103.9 kg)   LMP 01/01/2021 (Exact Date)   SpO2 98%   BMI 38.11 kg/m   BP Readings from Last 3 Encounters:  01/02/21 136/84  07/16/20 (!) 166/94  04/28/19 136/82    Wt Readings from Last 3 Encounters:  01/02/21 229 lb (103.9 kg)  07/16/20 241 lb (109.3 kg)  04/28/19 241 lb (109.3 kg)    Physical Exam Vitals reviewed.  HENT:     Nose: Nose normal.     Mouth/Throat:     Mouth: Mucous membranes are moist.  Eyes:     General: No scleral icterus.    Conjunctiva/sclera: Conjunctivae normal.  Cardiovascular:     Rate and Rhythm: Normal rate and regular rhythm.     Heart sounds: No murmur heard. Pulmonary:     Effort: Pulmonary effort is normal.     Breath sounds: No stridor. No wheezing, rhonchi or rales.  Abdominal:     General: Abdomen is protuberant. There is no distension.     Palpations: There is no mass.     Tenderness: There is no abdominal tenderness. There is no guarding.  Musculoskeletal:        General: Normal range of motion.     Cervical back: Neck supple.     Right lower leg: No edema.     Left lower leg: No edema.  Lymphadenopathy:     Cervical: No cervical adenopathy.  Skin:    General: Skin is warm and dry.  Neurological:     General: No focal deficit present.  Mental Status: Caroline Jones is alert.  Psychiatric:        Mood and Affect: Mood normal.    Lab Results  Component Value Date   WBC 4.8 01/02/2021   HGB 12.4 01/02/2021   HCT 38.6 01/02/2021   PLT 299.0 01/02/2021   GLUCOSE 93 01/02/2021   CHOL 202 (H) 07/16/2020   TRIG 65.0 07/16/2020   HDL 47.60 07/16/2020   LDLCALC 141 (H) 07/16/2020   ALT 15 07/16/2020   AST 13 07/16/2020   NA 141 01/02/2021   K 3.2 (L) 01/02/2021   CL 102 01/02/2021   CREATININE 0.56 01/02/2021   BUN 15 01/02/2021   CO2 29 01/02/2021   TSH 3.22 07/16/2020   HGBA1C 5.7 01/09/2016    MM 3D SCREEN BREAST BILATERAL  Result Date: 09/07/2018 CLINICAL DATA:  Screening. EXAM: DIGITAL  SCREENING BILATERAL MAMMOGRAM WITH TOMO AND CAD COMPARISON:  None. ACR Breast Density Category b: There are scattered areas of fibroglandular density. FINDINGS: There are no findings suspicious for malignancy. Images were processed with CAD. IMPRESSION: No mammographic evidence of malignancy. A result letter of this screening mammogram will be mailed directly to the patient. RECOMMENDATION: Screening mammogram in one year. (Code:SM-B-01Y) BI-RADS CATEGORY  1: Negative. Electronically Signed   By: Sherian Rein M.D.   On: 09/07/2018 09:59    Assessment & Plan:   Caroline Jones was seen today for hypertension.  Diagnoses and all orders for this visit:  Visit for screening mammogram -     MM DIGITAL SCREENING BILATERAL; Future  Essential hypertension, benign- Caroline Jones blood pressure is adequately well controlled.  I will treat the hypokalemia. -     triamterene-hydrochlorothiazide (DYAZIDE) 37.5-25 MG capsule; Take 1 each (1 capsule total) by mouth daily. -     CBC with Differential/Platelet; Future -     Basic metabolic panel; Future -     Basic metabolic panel -     CBC with Differential/Platelet -     potassium chloride (KLOR-CON 10) 10 MEQ tablet; Take 1 tablet (10 mEq total) by mouth 2 (two) times daily.  Chronic hypokalemia -     triamterene-hydrochlorothiazide (DYAZIDE) 37.5-25 MG capsule; Take 1 each (1 capsule total) by mouth daily. -     Basic metabolic panel; Future -     Basic metabolic panel -     potassium chloride (KLOR-CON 10) 10 MEQ tablet; Take 1 tablet (10 mEq total) by mouth 2 (two) times daily.  Other dietary vitamin B12 deficiency anemia- Caroline Jones H&H, B12, and folate are normal now. -     CBC with Differential/Platelet; Future -     Vitamin B12; Future -     Folate; Future -     Folate -     Vitamin B12 -     CBC with Differential/Platelet  Iron deficiency- Caroline Jones H&H and iron levels are normal now. -     CBC with Differential/Platelet; Future -     IBC + Ferritin; Future -      IBC + Ferritin -     CBC with Differential/Platelet  I am having Caroline Jones start on potassium chloride. I am also having Caroline Jones maintain Caroline Jones ACCRUFeR, Vitamin D3, and triamterene-hydrochlorothiazide.  Meds ordered this encounter  Medications   triamterene-hydrochlorothiazide (DYAZIDE) 37.5-25 MG capsule    Sig: Take 1 each (1 capsule total) by mouth daily.    Dispense:  90 capsule    Refill:  1   potassium chloride (KLOR-CON 10) 10 MEQ tablet  Sig: Take 1 tablet (10 mEq total) by mouth 2 (two) times daily.    Dispense:  180 tablet    Refill:  0     Follow-up: Return in about 6 months (around 07/03/2021).  Sanda Linger, MD

## 2021-05-07 ENCOUNTER — Other Ambulatory Visit: Payer: Self-pay | Admitting: Internal Medicine

## 2021-05-07 DIAGNOSIS — Z1231 Encounter for screening mammogram for malignant neoplasm of breast: Secondary | ICD-10-CM

## 2021-05-08 ENCOUNTER — Ambulatory Visit
Admission: RE | Admit: 2021-05-08 | Discharge: 2021-05-08 | Disposition: A | Discharge status: Home / Self Care | Payer: BC Managed Care – PPO | Source: Ambulatory Visit

## 2021-05-08 DIAGNOSIS — Z1231 Encounter for screening mammogram for malignant neoplasm of breast: Secondary | ICD-10-CM

## 2021-07-03 ENCOUNTER — Ambulatory Visit: Payer: BC Managed Care – PPO | Admitting: Internal Medicine

## 2021-07-05 ENCOUNTER — Other Ambulatory Visit: Payer: Self-pay | Admitting: Internal Medicine

## 2021-07-05 DIAGNOSIS — I10 Essential (primary) hypertension: Secondary | ICD-10-CM

## 2021-07-05 DIAGNOSIS — E876 Hypokalemia: Secondary | ICD-10-CM

## 2021-07-17 DIAGNOSIS — N925 Other specified irregular menstruation: Secondary | ICD-10-CM | POA: Diagnosis not present

## 2021-07-17 DIAGNOSIS — N912 Amenorrhea, unspecified: Secondary | ICD-10-CM | POA: Diagnosis not present

## 2021-07-17 DIAGNOSIS — Z3491 Encounter for supervision of normal pregnancy, unspecified, first trimester: Secondary | ICD-10-CM | POA: Diagnosis not present

## 2021-07-17 DIAGNOSIS — Z113 Encounter for screening for infections with a predominantly sexual mode of transmission: Secondary | ICD-10-CM | POA: Diagnosis not present

## 2021-07-19 ENCOUNTER — Encounter: Payer: Self-pay | Admitting: Internal Medicine

## 2021-07-19 LAB — OB RESULTS CONSOLE RPR: RPR: NONREACTIVE

## 2021-07-19 LAB — HEPATITIS C ANTIBODY: HCV Ab: NEGATIVE

## 2021-07-19 LAB — OB RESULTS CONSOLE RUBELLA ANTIBODY, IGM: Rubella: IMMUNE

## 2021-07-19 LAB — OB RESULTS CONSOLE HEPATITIS B SURFACE ANTIGEN: Hepatitis B Surface Ag: NEGATIVE

## 2021-07-21 ENCOUNTER — Encounter (HOSPITAL_COMMUNITY): Payer: Self-pay

## 2021-07-21 ENCOUNTER — Inpatient Hospital Stay (HOSPITAL_COMMUNITY)
Admission: AD | Admit: 2021-07-21 | Discharge: 2021-07-21 | Disposition: A | Discharge status: Home / Self Care | Payer: BC Managed Care – PPO | Attending: Obstetrics and Gynecology | Admitting: Obstetrics and Gynecology

## 2021-07-21 DIAGNOSIS — O10911 Unspecified pre-existing hypertension complicating pregnancy, first trimester: Secondary | ICD-10-CM

## 2021-07-21 DIAGNOSIS — Z79899 Other long term (current) drug therapy: Secondary | ICD-10-CM | POA: Insufficient documentation

## 2021-07-21 DIAGNOSIS — Z3A01 Less than 8 weeks gestation of pregnancy: Secondary | ICD-10-CM | POA: Insufficient documentation

## 2021-07-21 DIAGNOSIS — R300 Dysuria: Secondary | ICD-10-CM | POA: Diagnosis not present

## 2021-07-21 DIAGNOSIS — O26891 Other specified pregnancy related conditions, first trimester: Secondary | ICD-10-CM | POA: Diagnosis not present

## 2021-07-21 LAB — URINALYSIS, ROUTINE W REFLEX MICROSCOPIC
Bilirubin Urine: NEGATIVE
Glucose, UA: NEGATIVE mg/dL
Hgb urine dipstick: NEGATIVE
Ketones, ur: NEGATIVE mg/dL
Leukocytes,Ua: NEGATIVE
Nitrite: NEGATIVE
Protein, ur: NEGATIVE mg/dL
Specific Gravity, Urine: 1.006 (ref 1.005–1.030)
pH: 5 (ref 5.0–8.0)

## 2021-07-21 LAB — POCT PREGNANCY, URINE: Preg Test, Ur: POSITIVE — AB

## 2021-07-21 MED ORDER — NIFEDIPINE ER OSMOTIC RELEASE 30 MG PO TB24: 30.0000 mg | ORAL_TABLET | Freq: Every day | ORAL | 0 refills | Status: DC

## 2021-07-23 LAB — CULTURE, OB URINE: Special Requests: NORMAL

## 2021-08-01 DIAGNOSIS — N925 Other specified irregular menstruation: Secondary | ICD-10-CM | POA: Diagnosis not present

## 2021-08-14 DIAGNOSIS — O3680X9 Pregnancy with inconclusive fetal viability, other fetus: Secondary | ICD-10-CM | POA: Diagnosis not present

## 2021-08-14 DIAGNOSIS — Z3A10 10 weeks gestation of pregnancy: Secondary | ICD-10-CM | POA: Diagnosis not present

## 2021-08-22 ENCOUNTER — Inpatient Hospital Stay (HOSPITAL_COMMUNITY): Payer: BC Managed Care – PPO

## 2021-08-22 ENCOUNTER — Inpatient Hospital Stay (HOSPITAL_COMMUNITY)
Admission: AD | Admit: 2021-08-22 | Discharge: 2021-08-23 | Disposition: A | Discharge status: Home / Self Care | Payer: BC Managed Care – PPO | Attending: Obstetrics and Gynecology | Admitting: Obstetrics and Gynecology

## 2021-08-22 ENCOUNTER — Encounter (HOSPITAL_COMMUNITY): Payer: Self-pay | Admitting: Obstetrics and Gynecology

## 2021-08-22 DIAGNOSIS — O26851 Spotting complicating pregnancy, first trimester: Secondary | ICD-10-CM | POA: Diagnosis not present

## 2021-08-22 DIAGNOSIS — O10911 Unspecified pre-existing hypertension complicating pregnancy, first trimester: Secondary | ICD-10-CM | POA: Insufficient documentation

## 2021-08-22 DIAGNOSIS — O209 Hemorrhage in early pregnancy, unspecified: Secondary | ICD-10-CM | POA: Insufficient documentation

## 2021-08-22 DIAGNOSIS — Z3A12 12 weeks gestation of pregnancy: Secondary | ICD-10-CM | POA: Diagnosis not present

## 2021-08-22 DIAGNOSIS — Z3A11 11 weeks gestation of pregnancy: Secondary | ICD-10-CM

## 2021-08-22 DIAGNOSIS — N83202 Unspecified ovarian cyst, left side: Secondary | ICD-10-CM | POA: Diagnosis not present

## 2021-08-22 DIAGNOSIS — O09521 Supervision of elderly multigravida, first trimester: Secondary | ICD-10-CM | POA: Diagnosis not present

## 2021-08-22 DIAGNOSIS — O10919 Unspecified pre-existing hypertension complicating pregnancy, unspecified trimester: Secondary | ICD-10-CM

## 2021-08-22 DIAGNOSIS — O99211 Obesity complicating pregnancy, first trimester: Secondary | ICD-10-CM | POA: Insufficient documentation

## 2021-08-22 LAB — WET PREP, GENITAL
Clue Cells Wet Prep HPF POC: NONE SEEN
Sperm: NONE SEEN
Trich, Wet Prep: NONE SEEN
WBC, Wet Prep HPF POC: <10 (ref <10)
Yeast Wet Prep HPF POC: NONE SEEN

## 2021-08-22 LAB — URINALYSIS, ROUTINE W REFLEX MICROSCOPIC
Bilirubin Urine: NEGATIVE
Glucose, UA: NEGATIVE mg/dL
Ketones, ur: 5 mg/dL — AB
Leukocytes,Ua: NEGATIVE
Nitrite: NEGATIVE
Protein, ur: NEGATIVE mg/dL
Specific Gravity, Urine: 1.012 (ref 1.005–1.030)
pH: 5 (ref 5.0–8.0)

## 2021-08-22 MED ORDER — NIFEDIPINE ER OSMOTIC RELEASE 30 MG PO TB24
30.0000 mg | ORAL_TABLET | Freq: Once | ORAL | Status: AC
  Administered 2021-08-22: 30 mg via ORAL
  Filled 2021-08-22: qty 1

## 2021-08-22 MED ORDER — NIFEDIPINE ER 60 MG PO TB24: 60.0000 mg | ORAL_TABLET | Freq: Two times a day (BID) | ORAL | 0 refills | Status: DC

## 2021-08-22 NOTE — MAU Provider Note (Incomplete)
History     CSN: [id number]  Arrival date and time: 08/22/21 2112   Event Date/Time   First Provider Initiated Contact with Patient 08/22/21 2236      Chief Complaint  Patient presents with  . Vaginal Bleeding   Caroline Jones is a 41 y.o. year old G8P1102 female at [redacted]w[redacted]d weeks gestation who presents to MAU reporting pinkish-red blood on a Depends she was wearing and in the toilet @ 2000. No blood noted on Depends when she was in triage tonight. She denies cramping. Her high risk pregnancy is complicated by: obesity, AMA (>40 yo), cHTN on Nifedipine 30 mg daily, and h/o PTB. Her spouse is present and contributing to the history taking.   OB History     Gravida  3   Para  2   Term  1   Preterm  1   AB      Living  2      SAB      IAB      Ectopic      Multiple      Live Births  2           Past Medical History:  Diagnosis Date  . Hyperlipidemia   . Hypertension     Past Surgical History:  Procedure Laterality Date  . NO PAST SURGERIES      Family History  Problem Relation Age of Onset  . Hyperlipidemia Mother   . Hypertension Mother   . Diabetes Mother   . Cancer Neg Hx     Social History   Tobacco Use  . Smoking status: Never  . Smokeless tobacco: Never  Substance Use Topics  . Alcohol use: Yes    Alcohol/week: 2.0 standard drinks of alcohol    Types: 2 Glasses of wine per week  . Drug use: No    Allergies:  Allergies  Allergen Reactions  . Amlodipine     edema    Medications Prior to Admission  Medication Sig Dispense Refill Last Dose  . Prenatal Vit-Fe Fumarate-FA (MULTIVITAMIN-PRENATAL) 27-0.8 MG TABS tablet Take 1 tablet by mouth daily.   08/22/2021  . [DISCONTINUED] NIFEdipine (PROCARDIA-XL/NIFEDICAL-XL) 30 MG 24 hr tablet Take 1 tablet (30 mg total) by mouth daily. 30 tablet 0 08/22/2021 at 0700  . Cholecalciferol (VITAMIN D3) 50 MCG (2000 UT) TABS Take 1 tablet by mouth daily. 90 tablet 1 More than a month  .  Ferric Maltol (ACCRUFER) 30 MG CAPS Take 1 capsule by mouth in the morning and at bedtime. 180 capsule 1 More than a month  . potassium chloride (KLOR-CON 10) 10 MEQ tablet Take 1 tablet (10 mEq total) by mouth 2 (two) times daily. 180 tablet 0 More than a month    Review of Systems  Constitutional: Negative.   HENT: Negative.    Eyes: Negative.   Respiratory: Negative.    Cardiovascular: Negative.   Gastrointestinal: Negative.   Endocrine: Negative.   Genitourinary:  Negative for vaginal bleeding (pink spotting on Depends).  Musculoskeletal: Negative.   Skin: Negative.   Allergic/Immunologic: Negative.   Neurological: Negative.   Hematological: Negative.   Psychiatric/Behavioral: Negative.     Physical Exam   Patient Vitals for the past 24 hrs:  BP Temp Temp src Pulse Resp Height Weight  08/22/21 2300 (!) 178/98 -- -- 78 -- -- --  08/22/21 2201 (!) 170/92 98.7 F (37.1 C) Oral 79 18 5\' 4"  (1.626 m) 111.4 kg    Physical Exam  Vitals and nursing note reviewed. Exam conducted with a chaperone present.  Constitutional:      Appearance: Normal appearance. She is obese.  Cardiovascular:     Rate and Rhythm: Normal rate.  Pulmonary:     Effort: Pulmonary effort is normal.  Genitourinary:    General: Normal vulva.     Comments: Pelvic exam: External genitalia normal, SE: vaginal walls pink and well rugated, cervix is smooth, pink, no lesions, scant amt of clear vaginal d/c -- WP, GC/CT done, cervix visually open- Dilation: Fingertip/Effacement (%): Thick/Cervical Position: Posterior/Presentation: Undeterminable/Exam byCarloyn Jaeger, CNM , Uterus is non-tender, no CMT or friability, no adnexal tenderness. Musculoskeletal:        General: Normal range of motion.     Right lower leg: Edema (2+ pitting) present.     Left lower leg: Edema (2+) present.  Skin:    General: Skin is warm and dry.  Neurological:     Mental Status: She is alert.     MAU Course   Procedures  MDM CCUA Wet Prep GC/CT -- Results pending  U/S OB <14 with TV Nifedipine 30 XL po   Results for orders placed or performed during the hospital encounter of 08/22/21 (from the past 24 hour(s))  Urinalysis, Routine w reflex microscopic Urine, Clean Catch     Status: Abnormal   Collection Time: 08/22/21 10:35 PM  Result Value Ref Range   Color, Urine YELLOW YELLOW   APPearance CLOUDY (A) CLEAR   Specific Gravity, Urine 1.012 1.005 - 1.030   pH 5.0 5.0 - 8.0   Glucose, UA NEGATIVE NEGATIVE mg/dL   Hgb urine dipstick LARGE (A) NEGATIVE   Bilirubin Urine NEGATIVE NEGATIVE   Ketones, ur 5 (A) NEGATIVE mg/dL   Protein, ur NEGATIVE NEGATIVE mg/dL   Nitrite NEGATIVE NEGATIVE   Leukocytes,Ua NEGATIVE NEGATIVE   RBC / HPF 0-5 0 - 5 RBC/hpf   WBC, UA 0-5 0 - 5 WBC/hpf   Bacteria, UA RARE (A) NONE SEEN   Squamous Epithelial / LPF 11-20 0 - 5  Wet prep, genital     Status: None   Collection Time: 08/22/21 10:40 PM  Result Value Ref Range   Yeast Wet Prep HPF POC NONE SEEN NONE SEEN   Trich, Wet Prep NONE SEEN NONE SEEN   Clue Cells Wet Prep HPF POC NONE SEEN NONE SEEN   WBC, Wet Prep HPF POC <10 <10   Sperm NONE SEEN     US OB Comp Less 14 Wks  Result Date: 08/22/2021 CLINICAL DATA:  Spotting tonight. Quantitative beta HCG was not obtained. Estimated gestational age by LMP is 11 weeks 4 days. EXAM: OBSTETRIC <14 WK ULTRASOUND TECHNIQUE: Transabdominal ultrasound was performed for evaluation of the gestation as well as the maternal uterus and adnexal regions. COMPARISON:  None Available. FINDINGS: Intrauterine gestational sac: A single intrauterine pregnancy is demonstrated. Yolk sac:  Yolk sac is not visualized. Embryo:  Fetal pole is present. Cardiac Activity: Fetal cardiac activity is observed. Heart Rate: 162 bpm CRL:   62.6 mm   12 w 5 d                  [place] EDC: 03/01/2022 Subchorionic hemorrhage:  None visualized. Maternal uterus/adnexae: Uterus is retroverted. No  myometrial mass lesions are identified. Both ovaries are visualized and appear normal. The corpus luteal cyst is demonstrated in the left ovary. No abnormal pelvic fluid. IMPRESSION: Single intrauterine pregnancy. Estimated gestational age by crown-rump length is 12  weeks 5 days. No acute complication is indicated sonographically. Electronically Signed   By: Burman Nieves M.D.   On: 08/22/2021 23:28    Assessment and Plan  ***  Raelyn Mora, CNM 08/22/2021, 10:36 PM

## 2021-08-22 NOTE — Discharge Instructions (Addendum)
Your Nifedipine dose has been increased to 60 mg twice daily to better manage your blood pressure. Please keep a daily log of your blood pressure to take to your OB appointments.   Return to MAU: If you have heavier bleeding that soaks through more that 2 pads per hour for an hour or more If you bleed so much that you feel like you might pass out or you do pass out If you have significant abdominal pain that is not improved with Tylenol 1000 mg every 8 hours as needed for pain If you develop a fever > 100.5

## 2021-08-22 NOTE — MAU Note (Signed)
Pt says at 8pm- went to b-room- she had on a depend-  some reddish/pink blood on  depend  and in toilet. Denies cramping In Triage - went to b-room- pt says nothing on pad or when she wiped . Denies cramps now

## 2021-08-22 NOTE — MAU Provider Note (Signed)
History     CSN: [id number]  Arrival date and time: 08/22/21 2112   Event Date/Time   First Provider Initiated Contact with Patient 08/22/21 2236      Chief Complaint  Patient presents with   Vaginal Bleeding   Ms. Caroline Jones is a 41 y.o. year old G52P1102 female at [redacted]w[redacted]d weeks gestation who presents to MAU reporting pinkish-red blood on a Depends she was wearing and in the toilet @ 2000. No blood noted on Depends when she was in triage tonight. She denies cramping. Her high risk pregnancy is complicated by: obesity, AMA (>40 yo), cHTN on Nifedipine 30 mg daily, and h/o PTB. Her spouse is present and contributing to the history taking.   OB History     Gravida  3   Para  2   Term  1   Preterm  1   AB      Living  2      SAB      IAB      Ectopic      Multiple      Live Births  2           Past Medical History:  Diagnosis Date   Hyperlipidemia    Hypertension     Past Surgical History:  Procedure Laterality Date   NO PAST SURGERIES      Family History  Problem Relation Age of Onset   Hyperlipidemia Mother    Hypertension Mother    Diabetes Mother    Cancer Neg Hx     Social History   Tobacco Use   Smoking status: Never   Smokeless tobacco: Never  Substance Use Topics   Alcohol use: Yes    Alcohol/week: 2.0 standard drinks of alcohol    Types: 2 Glasses of wine per week   Drug use: No    Allergies:  Allergies  Allergen Reactions   Amlodipine     edema    Medications Prior to Admission  Medication Sig Dispense Refill Last Dose   Prenatal Vit-Fe Fumarate-FA (MULTIVITAMIN-PRENATAL) 27-0.8 MG TABS tablet Take 1 tablet by mouth daily.   08/22/2021   [DISCONTINUED] NIFEdipine (PROCARDIA-XL/NIFEDICAL-XL) 30 MG 24 hr tablet Take 1 tablet (30 mg total) by mouth daily. 30 tablet 0 08/22/2021 at 0700   Cholecalciferol (VITAMIN D3) 50 MCG (2000 UT) TABS Take 1 tablet by mouth daily. 90 tablet 1 More than a month   Ferric Maltol  (ACCRUFER) 30 MG CAPS Take 1 capsule by mouth in the morning and at bedtime. 180 capsule 1 More than a month   potassium chloride (KLOR-CON 10) 10 MEQ tablet Take 1 tablet (10 mEq total) by mouth 2 (two) times daily. 180 tablet 0 More than a month    Review of Systems  Constitutional: Negative.   HENT: Negative.    Eyes: Negative.   Respiratory: Negative.    Cardiovascular: Negative.   Gastrointestinal: Negative.   Endocrine: Negative.   Genitourinary:  Negative for vaginal bleeding (pink spotting on Depends).  Musculoskeletal: Negative.   Skin: Negative.   Allergic/Immunologic: Negative.   Neurological: Negative.   Hematological: Negative.   Psychiatric/Behavioral: Negative.     Physical Exam   Patient Vitals for the past 24 hrs:  BP Temp Temp src Pulse Resp Height Weight  08/23/21 0008 (!) 159/94 -- -- 82 -- -- --  08/22/21 2300 (!) 178/98 -- -- 78 -- -- --  08/22/21 2201 (!) 170/92 98.7 F (37.1 C) Oral 79 18  5\' 4"  (1.626 m) 111.4 kg    Physical Exam Vitals and nursing note reviewed. Exam conducted with a chaperone present.  Constitutional:      Appearance: Normal appearance. She is obese.  Cardiovascular:     Rate and Rhythm: Normal rate.  Pulmonary:     Effort: Pulmonary effort is normal.  Genitourinary:    General: Normal vulva.     Comments: Pelvic exam: External genitalia normal, SE: vaginal walls pink and well rugated, cervix is smooth, pink, no lesions, scant amt of clear vaginal d/c -- WP, GC/CT done, cervix visually open- Dilation: Fingertip/Effacement (%): Thick/Cervical Position: Posterior/Presentation: Undeterminable/Exam by , CNM , Uterus is non-tender, no CMT or friability, no adnexal tenderness. Musculoskeletal:        General: Normal range of motion.     Right lower leg: Edema (2+ pitting) present.     Left lower leg: Edema (2+) present.  Skin:    General: Skin is warm and dry.  Neurological:     Mental Status: She is alert.     MAU  Course  Procedures  MDM CCUA Wet Prep GC/CT -- Results pending  U/S OB <14 with TV Nifedipine 30 XL po   Results for orders placed or performed during the hospital encounter of 08/22/21 (from the past 24 hour(s))  Urinalysis, Routine w reflex microscopic Urine, Clean Catch     Status: Abnormal   Collection Time: 08/22/21 10:35 PM  Result Value Ref Range   Color, Urine YELLOW YELLOW   APPearance CLOUDY (A) CLEAR   Specific Gravity, Urine 1.012 1.005 - 1.030   pH 5.0 5.0 - 8.0   Glucose, UA NEGATIVE NEGATIVE mg/dL   Hgb urine dipstick LARGE (A) NEGATIVE   Bilirubin Urine NEGATIVE NEGATIVE   Ketones, ur 5 (A) NEGATIVE mg/dL   Protein, ur NEGATIVE NEGATIVE mg/dL   Nitrite NEGATIVE NEGATIVE   Leukocytes,Ua NEGATIVE NEGATIVE   RBC / HPF 0-5 0 - 5 RBC/hpf   WBC, UA 0-5 0 - 5 WBC/hpf   Bacteria, UA RARE (A) NONE SEEN   Squamous Epithelial / LPF 11-20 0 - 5  Wet prep, genital     Status: None   Collection Time: 08/22/21 10:40 PM  Result Value Ref Range   Yeast Wet Prep HPF POC NONE SEEN NONE SEEN   Trich, Wet Prep NONE SEEN NONE SEEN   Clue Cells Wet Prep HPF POC NONE SEEN NONE SEEN   WBC, Wet Prep HPF POC <10 <10   Sperm NONE SEEN     08/24/21 OB Comp Less 14 Wks  Result Date: 08/22/2021 CLINICAL DATA:  Spotting tonight. Quantitative beta HCG was not obtained. Estimated gestational age by LMP is 11 weeks 4 days. EXAM: OBSTETRIC <14 WK ULTRASOUND TECHNIQUE: Transabdominal ultrasound was performed for evaluation of the gestation as well as the maternal uterus and adnexal regions. COMPARISON:  None Available. FINDINGS: Intrauterine gestational sac: A single intrauterine pregnancy is demonstrated. Yolk sac:  Yolk sac is not visualized. Embryo:  Fetal pole is present. Cardiac Activity: Fetal cardiac activity is observed. Heart Rate: 162 bpm CRL:   62.6 mm   12 w 5 d                  08/24/2021 EDC: 03/01/2022 Subchorionic hemorrhage:  None visualized. Maternal uterus/adnexae: Uterus is retroverted. No  myometrial mass lesions are identified. Both ovaries are visualized and appear normal. The corpus luteal cyst is demonstrated in the left ovary. No abnormal pelvic fluid. IMPRESSION:  Single intrauterine pregnancy. Estimated gestational age by crown-rump length is 12 weeks 5 days. No acute complication is indicated sonographically. Electronically Signed   By: Burman Nieves M.D.   On: 08/22/2021 23:28    Assessment and Plan  Spotting affecting pregnancy in first trimester  - Return to MAU: If you have heavier bleeding that soaks through more that 2 pads per hour for an hour or more If you bleed so much that you feel like you might pass out or you do pass out If you have significant abdominal pain that is not improved with Tylenol 1000 mg every 8 hours as needed for pain If you develop a fever > 100.5   Chronic hypertension during pregnancy, antepartum  - Rx changed for Procardia dosing to 60 mg BID - Advised to pick up new Rx in the morning.  - Advised to call [facility] to inform of change in HTN med dosing and see if they need to see her sooner than her next scheduled appt  [redacted] weeks gestation of pregnancy   - Discharge patient  - Keep scheduled appt in 2.5 wks - Patient verbalized an understanding of the plan of care and agrees.    Raelyn Mora, CNM 08/23/2021, 10:36 PM

## 2021-08-22 NOTE — MAU Note (Signed)
In restroom 

## 2021-08-23 LAB — GC/CHLAMYDIA PROBE AMP (~~LOC~~) NOT AT ARMC
Chlamydia: NEGATIVE
Comment: NEGATIVE
Comment: NORMAL
Neisseria Gonorrhea: NEGATIVE

## 2021-08-26 DIAGNOSIS — Z3686 Encounter for antenatal screening for cervical length: Secondary | ICD-10-CM | POA: Diagnosis not present

## 2021-08-26 DIAGNOSIS — Z3A12 12 weeks gestation of pregnancy: Secondary | ICD-10-CM | POA: Diagnosis not present

## 2021-08-26 DIAGNOSIS — O3680X9 Pregnancy with inconclusive fetal viability, other fetus: Secondary | ICD-10-CM | POA: Diagnosis not present

## 2021-10-17 DIAGNOSIS — Z3686 Encounter for antenatal screening for cervical length: Secondary | ICD-10-CM | POA: Diagnosis not present

## 2021-10-17 DIAGNOSIS — Z3A19 19 weeks gestation of pregnancy: Secondary | ICD-10-CM | POA: Diagnosis not present

## 2021-10-17 DIAGNOSIS — Z363 Encounter for antenatal screening for malformations: Secondary | ICD-10-CM | POA: Diagnosis not present

## 2021-10-17 DIAGNOSIS — Z23 Encounter for immunization: Secondary | ICD-10-CM | POA: Diagnosis not present

## 2021-11-14 DIAGNOSIS — R7309 Other abnormal glucose: Secondary | ICD-10-CM | POA: Diagnosis not present

## 2021-11-14 DIAGNOSIS — Z363 Encounter for antenatal screening for malformations: Secondary | ICD-10-CM | POA: Diagnosis not present

## 2021-11-14 DIAGNOSIS — Z3A23 23 weeks gestation of pregnancy: Secondary | ICD-10-CM | POA: Diagnosis not present

## 2021-12-27 DIAGNOSIS — Z3A29 29 weeks gestation of pregnancy: Secondary | ICD-10-CM | POA: Diagnosis not present

## 2021-12-27 DIAGNOSIS — O3663X0 Maternal care for excessive fetal growth, third trimester, not applicable or unspecified: Secondary | ICD-10-CM | POA: Diagnosis not present

## 2021-12-27 DIAGNOSIS — O99213 Obesity complicating pregnancy, third trimester: Secondary | ICD-10-CM | POA: Diagnosis not present

## 2021-12-27 DIAGNOSIS — R030 Elevated blood-pressure reading, without diagnosis of hypertension: Secondary | ICD-10-CM | POA: Diagnosis not present

## 2022-01-27 NOTE — L&D Delivery Note (Addendum)
Delivery Note    Patient Name: Caroline Jones DOB: 01/10/1981 MRN: [id number]  Date of admission: 03/01/2022 Delivering MD: Noralyn Pick  Date of delivery: 03/02/2022 Type of delivery: SVD  Newborn Data: Live born female  Birth Weight:   APGAR: 68, 7  Newborn Delivery   Birth date/time: 03/02/2022 13:46:00 Delivery type: Vaginal, Spontaneous     Caroline Jones, 41 y.o., @ [redacted]w[redacted]d,  [id number], who was admitted for IOL for CHTN with SI PreE with SF with SR BP requiring IV labetalol protocol X2. Obesity BMI 46 . I was called to the room when she progressed 2+ station in the second stage of labor.  She pushed for 40/min.  She delivered a viable infant, cephalic and restituted to the ROA position over an intact perineum.  A nuchal cord   was not identified. Very short cord noted. The baby was placed on maternal abdomen while initial step of NRP were perfmored (Dry, Stimulated, and warmed). Hat placed on baby for thermoregulation. Delayed cord clamping was performed for 2 minutes.  Cord double clamped and cut.  Cord cut by fob. Apgar scores were 8 and 9. Prophylactic Pitocin was started in the third stage of labor for active management. The placenta delivered spontaneously, shultz, with a 3 vessel cord and was sent to LD, TXA hung r/t being on mag for 24 hours..  Inspection revealed none. An examination of the vaginal vault and cervix was free from lacerations. The uterus was firm, bleeding stable.   Placenta and umbilical artery blood gas were not sent.  There were no complications during the procedure.  Mom and baby skin to skin following delivery. Left in stable condition. Pt mag level was 6.2 and mag was decreased prior to delivery to 1mg /hr r/t fatigue, post delivery mag was increased back to 2gm/hr to continue for 24 hours. Pt also had another SR BP and started the hydralazine protocol BP (!) 169/94   Pulse 70   Temp (!) 97.5 F (36.4 C) (Oral)   Resp 16   Ht 5\' 4"  (1.626 m)   Wt 123.6 kg    LMP 06/02/2021   SpO2 93%   BMI 46.77 kg/m    Maternal Info: Anesthesia:Epidural Episiotomy: no Lacerations:  no Suture Repair: no Est. Blood Loss (mL):  166mls  Newborn Info:  Baby Sex: female Circumcision: in pt desired.  Babies Name: [place] APGAR (1 MIN): 8   APGAR (5 MINS): 9   APGAR (10 MINS):     Mom to postpartum.  Baby to Couplet care / Skin to Skin.  Delivery Report:    Review the Delivery Report for details.   [place], FNP-C, PMHNP-BC  [place] # [place],  [address]  Cell: [phone]  Office Phone: [phone] Fax: [phone] 03/02/2022  2:06 PM

## 2022-02-24 ENCOUNTER — Telehealth (HOSPITAL_COMMUNITY): Payer: Self-pay | Admitting: *Deleted

## 2022-02-24 ENCOUNTER — Encounter (HOSPITAL_COMMUNITY): Payer: Self-pay | Admitting: *Deleted

## 2022-02-24 LAB — OB RESULTS CONSOLE GBS: GBS: NEGATIVE

## 2022-02-24 NOTE — Telephone Encounter (Signed)
Preadmission screen  

## 2022-03-01 ENCOUNTER — Inpatient Hospital Stay (HOSPITAL_COMMUNITY): Payer: No Typology Code available for payment source | Admitting: Anesthesiology

## 2022-03-01 ENCOUNTER — Inpatient Hospital Stay (HOSPITAL_COMMUNITY): Payer: No Typology Code available for payment source

## 2022-03-01 ENCOUNTER — Encounter (HOSPITAL_COMMUNITY): Payer: Self-pay | Admitting: Obstetrics and Gynecology

## 2022-03-01 ENCOUNTER — Other Ambulatory Visit (HOSPITAL_COMMUNITY): Payer: Self-pay

## 2022-03-01 ENCOUNTER — Inpatient Hospital Stay (HOSPITAL_COMMUNITY)
Admission: RE | Admit: 2022-03-01 | Discharge: 2022-03-04 | DRG: 807 | Discharge status: Against Medical Advice | Payer: No Typology Code available for payment source | Attending: Obstetrics & Gynecology | Admitting: Obstetrics & Gynecology

## 2022-03-01 ENCOUNTER — Other Ambulatory Visit: Payer: Self-pay

## 2022-03-01 DIAGNOSIS — E611 Iron deficiency: Secondary | ICD-10-CM

## 2022-03-01 DIAGNOSIS — Z79899 Other long term (current) drug therapy: Secondary | ICD-10-CM | POA: Diagnosis not present

## 2022-03-01 DIAGNOSIS — O114 Pre-existing hypertension with pre-eclampsia, complicating childbirth: Principal | ICD-10-CM | POA: Diagnosis present

## 2022-03-01 DIAGNOSIS — Z3A38 38 weeks gestation of pregnancy: Secondary | ICD-10-CM | POA: Diagnosis not present

## 2022-03-01 DIAGNOSIS — O99213 Obesity complicating pregnancy, third trimester: Secondary | ICD-10-CM | POA: Diagnosis present

## 2022-03-01 DIAGNOSIS — O1002 Pre-existing essential hypertension complicating childbirth: Secondary | ICD-10-CM | POA: Diagnosis present

## 2022-03-01 DIAGNOSIS — O99214 Obesity complicating childbirth: Secondary | ICD-10-CM | POA: Diagnosis present

## 2022-03-01 DIAGNOSIS — O10919 Unspecified pre-existing hypertension complicating pregnancy, unspecified trimester: Principal | ICD-10-CM | POA: Diagnosis present

## 2022-03-01 LAB — COMPREHENSIVE METABOLIC PANEL
ALT: 14 U/L (ref 0–44)
AST: 22 U/L (ref 15–41)
Albumin: 2.4 g/dL — ABNORMAL LOW (ref 3.5–5.0)
Alkaline Phosphatase: 109 U/L (ref 38–126)
Anion gap: 8 (ref 5–15)
BUN: 13 mg/dL (ref 6–20)
CO2: 21 mmol/L — ABNORMAL LOW (ref 22–32)
Calcium: 8.8 mg/dL — ABNORMAL LOW (ref 8.9–10.3)
Chloride: 105 mmol/L (ref 98–111)
Creatinine, Ser: 0.66 mg/dL (ref 0.44–1.00)
GFR, Estimated: >60 mL/min (ref >60)
Glucose, Bld: 77 mg/dL (ref 70–99)
Potassium: 3.8 mmol/L (ref 3.5–5.1)
Sodium: 134 mmol/L — ABNORMAL LOW (ref 135–145)
Total Bilirubin: 0.3 mg/dL (ref 0.3–1.2)
Total Protein: 6.4 g/dL — ABNORMAL LOW (ref 6.5–8.1)

## 2022-03-01 LAB — CBC WITH DIFFERENTIAL/PLATELET
Abs Immature Granulocytes: 0.03 10*3/uL (ref 0.00–0.07)
Basophils Absolute: 0 10*3/uL (ref 0.0–0.1)
Basophils Relative: 0 %
Eosinophils Absolute: 0.1 10*3/uL (ref 0.0–0.5)
Eosinophils Relative: 1 %
HCT: 39.4 % (ref 36.0–46.0)
Hemoglobin: 13.5 g/dL (ref 12.0–15.0)
Immature Granulocytes: 0 %
Lymphocytes Relative: 13 %
Lymphs Abs: 1.1 10*3/uL (ref 0.7–4.0)
MCH: 26.5 pg (ref 26.0–34.0)
MCHC: 34.3 g/dL (ref 30.0–36.0)
MCV: 77.4 fL — ABNORMAL LOW (ref 80.0–100.0)
Monocytes Absolute: 0.4 10*3/uL (ref 0.1–1.0)
Monocytes Relative: 4 %
Neutro Abs: 7.1 10*3/uL (ref 1.7–7.7)
Neutrophils Relative %: 82 %
Platelets: 246 10*3/uL (ref 150–400)
RBC: 5.09 MIL/uL (ref 3.87–5.11)
RDW: 15.8 % — ABNORMAL HIGH (ref 11.5–15.5)
WBC: 8.7 10*3/uL (ref 4.0–10.5)
nRBC: 0 % (ref 0.0–0.2)

## 2022-03-01 LAB — CBC
HCT: 40.7 % (ref 36.0–46.0)
Hemoglobin: 13.4 g/dL (ref 12.0–15.0)
MCH: 26.2 pg (ref 26.0–34.0)
MCHC: 32.9 g/dL (ref 30.0–36.0)
MCV: 79.6 fL — ABNORMAL LOW (ref 80.0–100.0)
Platelets: 265 10*3/uL (ref 150–400)
RBC: 5.11 MIL/uL (ref 3.87–5.11)
RDW: 15.8 % — ABNORMAL HIGH (ref 11.5–15.5)
WBC: 8.9 10*3/uL (ref 4.0–10.5)
nRBC: 0 % (ref 0.0–0.2)

## 2022-03-01 LAB — PROTEIN / CREATININE RATIO, URINE
Creatinine, Urine: 28 mg/dL
Protein Creatinine Ratio: 0.21 mg/mg{Cre} — ABNORMAL HIGH (ref 0.00–0.15)
Total Protein, Urine: 6 mg/dL

## 2022-03-01 LAB — TYPE AND SCREEN
ABO/RH(D): O POS
Antibody Screen: NEGATIVE

## 2022-03-01 LAB — MAGNESIUM: Magnesium: 5.4 mg/dL — ABNORMAL HIGH (ref 1.7–2.4)

## 2022-03-01 LAB — RPR: RPR Ser Ql: NONREACTIVE

## 2022-03-01 MED ORDER — MISOPROSTOL 50MCG HALF TABLET
50.0000 ug | ORAL_TABLET | Freq: Once | ORAL | Status: AC
  Administered 2022-03-01: 50 ug via BUCCAL
  Filled 2022-03-01: qty 1

## 2022-03-01 MED ORDER — LABETALOL HCL 5 MG/ML IV SOLN
INTRAVENOUS | Status: AC
  Administered 2022-03-01: 80 mg via INTRAVENOUS
  Filled 2022-03-01: qty 16

## 2022-03-01 MED ORDER — ONDANSETRON HCL 4 MG/2ML IJ SOLN
4.0000 mg | Freq: Four times a day (QID) | INTRAMUSCULAR | Status: DC | PRN
  Administered 2022-03-01 – 2022-03-02 (×3): 4 mg via INTRAVENOUS
  Filled 2022-03-01 (×3): qty 2

## 2022-03-01 MED ORDER — FENTANYL-BUPIVACAINE-NACL 0.5-0.125-0.9 MG/250ML-% EP SOLN
12.0000 mL/h | EPIDURAL | Status: DC | PRN
  Administered 2022-03-01: 12 mL/h via EPIDURAL
  Filled 2022-03-01: qty 250

## 2022-03-01 MED ORDER — LABETALOL HCL 5 MG/ML IV SOLN: 40.0000 mg | INTRAVENOUS | Status: DC | PRN

## 2022-03-01 MED ORDER — MISOPROSTOL 50MCG HALF TABLET
50.0000 ug | ORAL_TABLET | Freq: Once | ORAL | Status: AC
  Administered 2022-03-01: 50 ug via ORAL
  Filled 2022-03-01: qty 1

## 2022-03-01 MED ORDER — LABETALOL HCL 5 MG/ML IV SOLN
INTRAVENOUS | Status: AC
  Filled 2022-03-01: qty 4

## 2022-03-01 MED ORDER — MISOPROSTOL 25 MCG QUARTER TABLET
25.0000 ug | ORAL_TABLET | Freq: Once | ORAL | Status: AC
  Administered 2022-03-01: 25 ug via VAGINAL
  Filled 2022-03-01: qty 1

## 2022-03-01 MED ORDER — TERBUTALINE SULFATE 1 MG/ML IJ SOLN
0.2500 mg | Freq: Once | INTRAMUSCULAR | Status: DC | PRN
  Filled 2022-03-01: qty 1

## 2022-03-01 MED ORDER — OXYTOCIN-SODIUM CHLORIDE 30-0.9 UT/500ML-% IV SOLN
2.5000 [IU]/h | INTRAVENOUS | Status: DC
  Administered 2022-03-02: 2.5 [IU]/h via INTRAVENOUS

## 2022-03-01 MED ORDER — ACETAMINOPHEN 325 MG PO TABS: 650.0000 mg | ORAL_TABLET | ORAL | Status: DC | PRN

## 2022-03-01 MED ORDER — HYDRALAZINE HCL 20 MG/ML IJ SOLN: 10.0000 mg | INTRAMUSCULAR | Status: DC | PRN

## 2022-03-01 MED ORDER — HYDRALAZINE HCL 20 MG/ML IJ SOLN
10.0000 mg | INTRAMUSCULAR | Status: DC | PRN
  Administered 2022-03-01 – 2022-03-02 (×3): 10 mg via INTRAVENOUS
  Filled 2022-03-01 (×2): qty 1

## 2022-03-01 MED ORDER — OXYTOCIN-SODIUM CHLORIDE 30-0.9 UT/500ML-% IV SOLN: 2.5000 [IU]/h | INTRAVENOUS | Status: DC

## 2022-03-01 MED ORDER — OXYCODONE-ACETAMINOPHEN 5-325 MG PO TABS: 2.0000 | ORAL_TABLET | ORAL | Status: DC | PRN

## 2022-03-01 MED ORDER — PHENYLEPHRINE 80 MCG/ML (10ML) SYRINGE FOR IV PUSH (FOR BLOOD PRESSURE SUPPORT): 80.0000 ug | PREFILLED_SYRINGE | INTRAVENOUS | Status: DC | PRN

## 2022-03-01 MED ORDER — SOD CITRATE-CITRIC ACID 500-334 MG/5ML PO SOLN: 30.0000 mL | ORAL | Status: DC | PRN

## 2022-03-01 MED ORDER — LABETALOL HCL 5 MG/ML IV SOLN: 80.0000 mg | INTRAVENOUS | Status: DC | PRN

## 2022-03-01 MED ORDER — HYDRALAZINE HCL 20 MG/ML IJ SOLN
5.0000 mg | INTRAMUSCULAR | Status: DC | PRN
  Administered 2022-03-01 – 2022-03-02 (×3): 5 mg via INTRAVENOUS
  Filled 2022-03-01 (×3): qty 1

## 2022-03-01 MED ORDER — LACTATED RINGERS IV SOLN
500.0000 mL | Freq: Once | INTRAVENOUS | Status: AC
  Administered 2022-03-01: 250 mL via INTRAVENOUS

## 2022-03-01 MED ORDER — DIPHENHYDRAMINE HCL 50 MG/ML IJ SOLN: 12.5000 mg | INTRAMUSCULAR | Status: DC | PRN

## 2022-03-01 MED ORDER — MISOPROSTOL 25 MCG QUARTER TABLET: 25.0000 ug | ORAL_TABLET | Freq: Once | ORAL | Status: DC

## 2022-03-01 MED ORDER — FENTANYL CITRATE (PF) 100 MCG/2ML IJ SOLN: 50.0000 ug | INTRAMUSCULAR | Status: DC | PRN

## 2022-03-01 MED ORDER — LABETALOL HCL 100 MG PO TABS
100.0000 mg | ORAL_TABLET | Freq: Two times a day (BID) | ORAL | Status: DC
  Administered 2022-03-01: 100 mg via ORAL
  Filled 2022-03-01: qty 1

## 2022-03-01 MED ORDER — OXYTOCIN BOLUS FROM INFUSION
333.0000 mL | Freq: Once | INTRAVENOUS | Status: AC
  Administered 2022-03-02: 333 mL via INTRAVENOUS

## 2022-03-01 MED ORDER — LACTATED RINGERS IV SOLN: INTRAVENOUS | Status: DC

## 2022-03-01 MED ORDER — LABETALOL HCL 5 MG/ML IV SOLN
40.0000 mg | INTRAVENOUS | Status: DC | PRN
  Administered 2022-03-01: 40 mg via INTRAVENOUS
  Filled 2022-03-01: qty 8

## 2022-03-01 MED ORDER — OXYTOCIN BOLUS FROM INFUSION: 333.0000 mL | Freq: Once | INTRAVENOUS | Status: DC

## 2022-03-01 MED ORDER — HYDROXYZINE HCL 50 MG PO TABS: 50.0000 mg | ORAL_TABLET | Freq: Four times a day (QID) | ORAL | Status: DC | PRN

## 2022-03-01 MED ORDER — LIDOCAINE HCL (PF) 1 % IJ SOLN: 30.0000 mL | INTRAMUSCULAR | Status: DC | PRN

## 2022-03-01 MED ORDER — EPHEDRINE 5 MG/ML INJ: 10.0000 mg | INTRAVENOUS | Status: DC | PRN

## 2022-03-01 MED ORDER — TERBUTALINE SULFATE 1 MG/ML IJ SOLN: 0.2500 mg | Freq: Once | INTRAMUSCULAR | Status: DC | PRN

## 2022-03-01 MED ORDER — LACTATED RINGERS IV SOLN: 500.0000 mL | INTRAVENOUS | Status: DC | PRN

## 2022-03-01 MED ORDER — LABETALOL HCL 5 MG/ML IV SOLN: 20.0000 mg | INTRAVENOUS | Status: DC | PRN

## 2022-03-01 MED ORDER — LABETALOL HCL 5 MG/ML IV SOLN
INTRAVENOUS | Status: AC
  Filled 2022-03-01: qty 8

## 2022-03-01 MED ORDER — OXYTOCIN-SODIUM CHLORIDE 30-0.9 UT/500ML-% IV SOLN
1.0000 m[IU]/min | INTRAVENOUS | Status: DC
  Administered 2022-03-02: 2 m[IU]/min via INTRAVENOUS
  Administered 2022-03-02: 1 m[IU]/min via INTRAVENOUS

## 2022-03-01 MED ORDER — MAGNESIUM SULFATE BOLUS VIA INFUSION
4.0000 g | Freq: Once | INTRAVENOUS | Status: AC
  Administered 2022-03-01: 4 g via INTRAVENOUS
  Filled 2022-03-01: qty 1000

## 2022-03-01 MED ORDER — LABETALOL HCL 5 MG/ML IV SOLN
20.0000 mg | INTRAVENOUS | Status: DC | PRN
  Administered 2022-03-01 (×3): 20 mg via INTRAVENOUS
  Filled 2022-03-01 (×2): qty 4

## 2022-03-01 MED ORDER — ONDANSETRON HCL 4 MG/2ML IJ SOLN: 4.0000 mg | Freq: Four times a day (QID) | INTRAMUSCULAR | Status: DC | PRN

## 2022-03-01 MED ORDER — LABETALOL HCL 5 MG/ML IV SOLN
20.0000 mg | INTRAVENOUS | Status: DC | PRN
  Administered 2022-03-03: 20 mg via INTRAVENOUS
  Filled 2022-03-01 (×2): qty 4

## 2022-03-01 MED ORDER — OXYTOCIN-SODIUM CHLORIDE 30-0.9 UT/500ML-% IV SOLN
1.0000 m[IU]/min | INTRAVENOUS | Status: DC
  Filled 2022-03-01: qty 500

## 2022-03-01 MED ORDER — MAGNESIUM SULFATE 40 GM/1000ML IV SOLN
2.0000 g/h | INTRAVENOUS | Status: DC
  Administered 2022-03-01 (×2): 2 g/h via INTRAVENOUS
  Filled 2022-03-01 (×2): qty 1000

## 2022-03-01 MED ORDER — LABETALOL HCL 200 MG PO TABS
200.0000 mg | ORAL_TABLET | Freq: Once | ORAL | Status: AC
  Administered 2022-03-01: 200 mg via ORAL
  Filled 2022-03-01: qty 1

## 2022-03-01 MED ORDER — LABETALOL HCL 200 MG PO TABS
200.0000 mg | ORAL_TABLET | Freq: Two times a day (BID) | ORAL | Status: DC
  Administered 2022-03-02 – 2022-03-03 (×4): 200 mg via ORAL
  Filled 2022-03-01 (×4): qty 1

## 2022-03-01 MED ORDER — OXYCODONE-ACETAMINOPHEN 5-325 MG PO TABS: 1.0000 | ORAL_TABLET | ORAL | Status: DC | PRN

## 2022-03-01 NOTE — H&P (Signed)
OB ADMISSION/ HISTORY & PHYSICAL:  Admission Date: 03/01/2022 12:08 AM  Admit Diagnosis: [facility] Caroline Jones is a 42 y.o. female [phone] [redacted]w[redacted]d presenting for IOL. Endorses active FM, denies LOF and vaginal bleeding. Pregnancy complicated by [facility] managed on Procardia XL 60 mg PO BID.   Severe range BP on admission. Pt states BP is always higher with the automatic cuff. Manual cuff BP congruent with automatic cuff. Pt is asymptomatic with a normal physical assessment. Preeclampsia labs ordered. Marland Kitchen  History of current pregnancy: [id number]   Patient entered care with [facility] at 6+4 wks.   EDC 03/09/22 by LMP and congruent w/ 6 wk U/S.   Anatomy scan:  20 wks, complete w/ posterior placenta.   Antenatal testing: for AMA and BMI of 47 started at 32 weeks Last evaluation: 36 wks cephalic, anterior placenta, AFI: 10.64cm, EFW 6.9lbs (65%), BPP: 8/8.  Significant prenatal events:  Patient Active Problem List   Diagnosis Date Noted   Chronic hypertension affecting pregnancy 03/01/2022   Maternal obesity syndrome in third trimester 95/18/8416   Systolic murmur 60/63/0160   Chronic hypokalemia 07/28/2018   Raynauds phenomenon 09/10/2017   Vitamin D deficiency disease 09/10/2017   Other dietary vitamin B12 deficiency anemia 01/14/2016   Anemia, iron deficiency 01/10/2016   Essential hypertension, benign 10/21/2010   Pure hypercholesterolemia 10/21/2010    Prenatal Labs: ABO, Rh: --/--/O POS (02/03 0045) Antibody: NEG (02/03 0045) Rubella: Immune (06/23 0000)  RPR: Nonreactive (06/23 0000)  HBsAg: Negative (06/23 0000)  HIV:   NR GTT: Normal 1 hr GBS: Negative/-- (01/29 0000)  GC/CHL: neg/neg Genetics: declined Vaccines: Tdap: current Influenza: current   OB History  Gravida Para Term Preterm AB Living  3 2 1 1   2   SAB IAB Ectopic Multiple Live Births          2    # Outcome Date GA Lbr Len/2nd Weight Sex Delivery Anes PTL Lv  3 Current           2 Term 04/21/09    F Vag-Spont    LIV  1 Preterm 07/05/06 [redacted]w[redacted]d   F Vag-Spont   LIV    Medical / Surgical History: Past medical history:  Past Medical History:  Diagnosis Date   Hyperlipidemia    Hypertension    Spinal headache     Past surgical history:  Past Surgical History:  Procedure Laterality Date   NO PAST SURGERIES     Family History:  Family History  Problem Relation Age of Onset   Hyperlipidemia Mother    Hypertension Mother    Diabetes Mother    Cancer Neg Hx     Social History:  reports that she has never smoked. She has never used smokeless tobacco. She reports current alcohol use of about 2.0 standard drinks of alcohol per week. She reports that she does not use drugs.  Allergies: Amlodipine   Current Medications at time of admission:  Prior to Admission medications   Medication Sig Start Date End Date Taking? Authorizing Provider  NIFEdipine (ADALAT CC) 60 MG 24 hr tablet Take 1 tablet (60 mg total) by mouth 2 (two) times daily. 08/22/21 03/01/22 Yes Laury Deep, CNM  Prenatal Vit-Fe Fumarate-FA (MULTIVITAMIN-PRENATAL) 27-0.8 MG TABS tablet Take 1 tablet by mouth daily. 07/17/21  Yes [provider]  Cholecalciferol (VITAMIN D3) 50 MCG (2000 UT) TABS Take 1 tablet by mouth daily. 07/17/20   Janith Lima, MD  Ferric Maltol (ACCRUFER) 30 MG CAPS Take  1 capsule by mouth in the morning and at bedtime. 07/16/20   Janith Lima, MD    Review of Systems: Constitutional: Negative   HENT: Negative   Eyes: Negative   Respiratory: Negative   Cardiovascular: Negative   Gastrointestinal: Negative  Genitourinary: neg for bloody show, neg for LOF   Musculoskeletal: Negative   Skin: Negative   Neurological: Negative   Endo/Heme/Allergies: Negative   Psychiatric/Behavioral: Negative    Physical Exam: VS: Blood pressure (!) 156/92, pulse 65, temperature 98.1 F (36.7 C), temperature source Oral, resp. rate 18, height 5\' 4"  (1.626 m), weight 123.6 kg, last menstrual period  06/02/2021. AAO x3, no signs of distress Cardiovascular: RRR Respiratory: Unlabored GU/GI: Abdomen gravid, non-tender, non-distended, active FM, vertex Extremities: 2+ edema, negative for pain, tenderness, and cords Neuro: 2+ reflexes, neg clonus  Cervical exam:Dilation: Closed Effacement (%): Thick Station: Ballotable Exam by:: VGrice, CNM FHR: baseline rate 140 / variability moderate / accelerations present / absent decelerations TOCO: 2-7   Prenatal Transfer Tool  Maternal Diabetes: No Genetic Screening: Declined Maternal Ultrasounds/Referrals: Normal Fetal Ultrasounds or other Referrals:  None Maternal Substance Abuse:  No Significant Maternal Medications:  Meds include: Other: Procardia XL 60 mg BID Significant Maternal Lab Results: Group B Strep negative Number of Prenatal Visits:greater than 3 verified prenatal visits Other Comments:   Chronic HTN    Assessment: 42 y.o. [id number] [redacted]w[redacted]d IOL for CHTN    -managed on Procardia XL 60 mg BID    -Severe range BP values on admission    -CBC, CMP, protein creatinine ratio all normal (PCR 0.21) Start Labetalol protocol     -poor response to IV antihypertensive, severe values responded to 80 mg IV Start magnesium sulfate New onset CHTN with SI pre-eclampsia    -neg neuro S/S  FHR category 1 GBS neg Pain management plan: per pt's request   Plan:  Cytotec 50 mcg buccal Dr Alesia Richards notified of admission and plan of care  Arrie Eastern DNP, CNM 03/01/2022 3:44 AM

## 2022-03-01 NOTE — Progress Notes (Signed)
Caroline Jones is a 42 y.o. [id number] at [redacted]w[redacted]d admitted  for induction of labor for chronic HTN on medication, with superimposed preeclampsia with severe features,   Chart reviewed remotely.   Objective: BP (!) 161/90   Pulse 65   Temp (!) 97.5 F (36.4 C) (Oral)   Resp 16   Ht 5\' 4"  (1.626 m)   Wt 123.6 kg   LMP 06/02/2021   SpO2 94%   BMI 46.77 kg/m  I/O last 3 completed shifts: In: 1146.1 [I.V.:1146.1] Out: 900 [Urine:900] Total I/O In: 450 [P.O.:450] Out: 2400 [Urine:2400]  FHT:  FHR: 140 bpm, variability: moderate,  accelerations:  Present,  decelerations:  Absent UC:   irregular, every 3 to 5 minutes SVE:   Dilation: Closed Effacement (%): 50 Station: -3 Exam by:: Dr. Alesia Richards  Labs: Lab Results  Component Value Date   WBC 8.9 03/01/2022   HGB 13.4 03/01/2022   HCT 40.7 03/01/2022   MCV 79.6 (L) 03/01/2022   PLT 265 03/01/2022   Assessment / Plan: 42 y.o. [name] at [redacted]w[redacted]d admitted  for induction of labor for chronic HTN on medication, with superimposed preeclampsia with severe features,   Labor:   Patient is s/p 2 sets of vaginal and buccal cytotec and then buccal cytotec. For cervical reassessment at about 1630.  Preeclampsia:  on magnesium sulfate, intake and ouput balanced, and will increase magnesium rate back to 2g/hr due to continued severe range blood pressures.  Patient is s/p IV labetalol protocol three times.  Will give one more dose of oral labetalol 200 mg PO X 1 and plan for 200 mg PO at about 2200.  Will repeat preeclampsia labs at midnight.   Fetal Wellbeing:  Category II. Pain Control:  Epidural, IV pain meds, and As desired by patient.  I/D:   GBS negative. Anticipated MOD:  NSVD.   Archie Endo, MD 03/01/2022, 3:48 PM

## 2022-03-01 NOTE — Progress Notes (Signed)
Labor Progress Note  Caroline Jones, 42 y.o., [phone], with an IUP @ [redacted]w[redacted]d, presented for IOL for CHTN with SI PreE with SF with SR BP requiring IV labetalol protocol X2. Obesity BMI 46  Subjective: Reviewed POC with pt, reviwed R/B/A of foley and pitcon and cytotec, pt informed cxt toom nay times to safely give another cytotec, pt was ok with foley bulb placement, although very anxious, cervix still closed, pt then ok to start pitocin 1x1 to max of 10 until cervix ripe. Questions answered. Pt stable. Feeling cxt, reviewed pain management, pt plans o on getting epidural soon.  Patient Active Problem List   Diagnosis Date Noted   Chronic hypertension affecting pregnancy 03/01/2022   Maternal obesity syndrome in third trimester 44/31/5400   Systolic murmur 86/76/1950   Chronic hypokalemia 07/28/2018   Raynauds phenomenon 09/10/2017   Vitamin D deficiency disease 09/10/2017   Other dietary vitamin B12 deficiency anemia 01/14/2016   Anemia, iron deficiency 01/10/2016   Essential hypertension, benign 10/21/2010   Pure hypercholesterolemia 10/21/2010   Objective: BP (!) 164/83   Pulse 66   Temp (!) 97.5 F (36.4 C) (Oral)   Resp 16   Ht 5\' 4"  (1.626 m)   Wt 123.6 kg   LMP 06/02/2021   SpO2 94%   BMI 46.77 kg/m  I/O last 3 completed shifts: In: 1596.1 [P.O.:450; I.V.:1146.1] Out: 3300 [Urine:3300] No intake/output data recorded. NST: FHR baseline 135 bpm, Variability: moderate, Accelerations:present, Decelerations:  Absent= Cat 1/Reactive CTX:  irregular, every 2-5 minutes Uterus gravid, soft non tender, moderate to palpate with contractions.  SVE:  Dilation: Closed Effacement (%): Thick Station: -3 Exam by:: Kathie Dike, NP Pitocin at Plan to start at 1 mUn/min  Assessment:  Caroline Jones, 42 y.o., [phone], with an IUP @ [redacted]w[redacted]d, presented for IOL for CHTN with SI PreE with SF with SR BP requiring IV labetalol protocol X2. Obesity BMI 64.  Patient Active Problem  List   Diagnosis Date Noted   Chronic hypertension affecting pregnancy 03/01/2022   Maternal obesity syndrome in third trimester 45/80/9983   Systolic murmur 38/25/0539   Chronic hypokalemia 07/28/2018   Raynauds phenomenon 09/10/2017   Vitamin D deficiency disease 09/10/2017   Other dietary vitamin B12 deficiency anemia 01/14/2016   Anemia, iron deficiency 01/10/2016   Essential hypertension, benign 10/21/2010   Pure hypercholesterolemia 10/21/2010   NICHD: Category 1  Membranes: Intact, no s/s of infection  Induction:    Cytotec x ( 50bu/25va @ 0159) (50BU/25VA @ 0627) (50BU @ 1140) (BU 74mcg @ 1602)  Foley Bulb: N/A cervix closed  Pitocin - Plan to start at 1 now r/t frequent cxts.   Pain management:               IV pain management: x fentanyl PRN  Nitrous: PRN             Epidural placement: PRN  GBS Negative  CHTN with SR BP: PCR 0.21, cmp unremarkable, plat 246, drawn at 1958 on 2/3, asymptomatic, was on procardia 60mg  XL during pregnancy, held while on magnesium,  SR BP required IV labetalol protocol x2 , continue mag 2gm/hr, mag level in process.    Plan: Continue labor plan Continuous monitoring Rest Ambulate Frequent position changes to facilitate fetal rotation and descent. Will reassess with cervical exam at 0400 or earlier if necessary CHTN with SI PreE: switch to hydralazine protocol, given if SR BP noted, continue labetalol 200mg  PO BID, strick IO, neuro checks, redraw  CBC w/I 4-6 hours for epidural  Start pitocin per protocol 1x1 to max of 10 r/t too many cxt for another cytotex/  Anticipate labor progression and vaginal delivery.    [place], FNP-C, PMHNP-BC  [address] [place] # [place], [place] [address]  Cell: [phone]  Office Phone: [phone] Fax: [phone] 03/01/2022  8:48 PM

## 2022-03-01 NOTE — Progress Notes (Addendum)
Caroline Jones is a 42 y.o. [phone] at [redacted]w[redacted]d admitted  for induction of labor for chronic HTN on medication, with superimposed preeclampsia with severe features,  Subjective: Patient feels groggy and  tired since magnesium sulfate was started. She denies nausea/vomiting/chest pain/shortness of breath/abdominal pain.  She feels mild contractions.  She denies vaginal bleeding.  With normal fetal movement.   Objective: BP 138/82   Pulse 63   Temp (!) 97.5 F (36.4 C) (Oral)   Resp 16   Ht 5\' 4"  (1.626 m)   Wt 123.6 kg   LMP 06/02/2021   SpO2 94%   BMI 46.77 kg/m  I/O last 3 completed shifts: In: 1146.1 [I.V.:1146.1] Out: 900 [Urine:900] Total I/O In: 41 [P.O.:50] Out: 600 [Urine:600]    03/01/2022   11:01 AM 03/01/2022   10:31 AM 03/01/2022   10:01 AM  Vitals with BMI  Systolic 154 008 676  Diastolic 82 81 85  Pulse 63 62 59   CVS: s1. S2, RRR Pulmonary: Clear to auscultation bilaterally. Abdomen: Soft, gravid. Extremity: Warm and well perfused, 2+ edema bilaterally on lower extremities, 2+ patellar reflex bilaterally.   FHT:  FHR: 130 bpm, variability: minimal,  accelerations:  Present,  decelerations:  Present Late UC:   irregular, every 2 to 5 minutes SVE:   Dilation: Closed Effacement (%): 50 Station: -3 Exam by:: Dr. Alesia Richards  Labs: Lab Results  Component Value Date   WBC 8.9 03/01/2022   HGB 13.4 03/01/2022   HCT 40.7 03/01/2022   MCV 79.6 (L) 03/01/2022   PLT 265 03/01/2022   CMP     Component Value Date/Time   NA 134 (L) 03/01/2022 0045   K 3.8 03/01/2022 0045   CL 105 03/01/2022 0045   CO2 21 (L) 03/01/2022 0045   GLUCOSE 77 03/01/2022 0045   BUN 13 03/01/2022 0045   CREATININE 0.66 03/01/2022 0045   CALCIUM 8.8 (L) 03/01/2022 0045   PROT 6.4 (L) 03/01/2022 0045   ALBUMIN 2.4 (L) 03/01/2022 0045   AST 22 03/01/2022 0045   ALT 14 03/01/2022 0045   ALKPHOS 109 03/01/2022 0045   BILITOT 0.3 03/01/2022 0045   GFRNONAA >60 03/01/2022 0045   GFRAA   04/20/2009 0745    >60        The eGFR has been calculated using the MDRD equation. This calculation has not been validated in all clinical situations. eGFR's persistently <60 mL/min signify possible Chronic Kidney Disease.    03/01/22:   Ref Range & Units 02:19 15 yr ago   Creatinine, Urine mg/dL 28 141.0 R  Total Protein, Urine mg/dL 6   Comment: NO NORMAL RANGE ESTABLISHED FOR THIS TEST  Protein Creatinine Ratio 0.00 - 0.15 mg/mg 0.21 High     Assessment / Plan: 42 y.o. [id number] at [redacted]w[redacted]d admitted  for induction of labor for chronic HTN on medication, with superimposed preeclampsia with severe features,  Labor:  Positional changes done. If tracing remains category I then will give buccal cytotec. Patient is s/p 2 sets of vaginal and buccal cytotec. Preeclampsia:  on magnesium sulfate, intake and ouput balanced, and will decrease magnesium rate due to mother feeling unpleasant side effects of magnesium sulfate . Oral labetalol 100 mg PO BID, Procardia 60 mg XL BID has been held. Fetal Wellbeing:  Category II Pain Control:  Epidural, IV pain meds, and As desired by patient.  I/D:   GBS negative. Anticipated MOD:  NSVD  Archie Endo, MD 03/01/2022, 11:27 AM

## 2022-03-01 NOTE — Anesthesia Preprocedure Evaluation (Signed)
Anesthesia Evaluation  Patient identified by MRN, date of birth, ID band Patient awake    Reviewed: Allergy & Precautions, H&P , NPO status , Patient's Chart, lab work & pertinent test results  Airway Mallampati: II  TM Distance: >3 FB Neck ROM: Full    Dental no notable dental hx.    Pulmonary neg pulmonary ROS   Pulmonary exam normal breath sounds clear to auscultation       Cardiovascular hypertension, Pt. on medications negative cardio ROS Normal cardiovascular exam Rhythm:Regular Rate:Normal     Neuro/Psych negative neurological ROS  negative psych ROS   GI/Hepatic negative GI ROS, Neg liver ROS,,,  Endo/Other    Morbid obesity  Renal/GU negative Renal ROS  negative genitourinary   Musculoskeletal negative musculoskeletal ROS (+)    Abdominal  (+) + obese  Peds negative pediatric ROS (+)  Hematology negative hematology ROS (+)   Anesthesia Other Findings   Reproductive/Obstetrics (+) Pregnancy                             Anesthesia Physical Anesthesia Plan  ASA: 3  Anesthesia Plan: Epidural   Post-op Pain Management:    Induction:   PONV Risk Score and Plan:   Airway Management Planned:   Additional Equipment:   Intra-op Plan:   Post-operative Plan:   Informed Consent:   Plan Discussed with:   Anesthesia Plan Comments:        Anesthesia Quick Evaluation

## 2022-03-02 ENCOUNTER — Encounter (HOSPITAL_COMMUNITY): Payer: Self-pay | Admitting: Obstetrics and Gynecology

## 2022-03-02 LAB — COMPREHENSIVE METABOLIC PANEL
ALT: 30 U/L (ref 0–44)
ALT: 35 U/L (ref 0–44)
AST: 32 U/L (ref 15–41)
AST: 34 U/L (ref 15–41)
Albumin: 2.4 g/dL — ABNORMAL LOW (ref 3.5–5.0)
Albumin: 2.4 g/dL — ABNORMAL LOW (ref 3.5–5.0)
Alkaline Phosphatase: 123 U/L (ref 38–126)
Alkaline Phosphatase: 112 U/L (ref 38–126)
Anion gap: 10 (ref 5–15)
Anion gap: 10 (ref 5–15)
BUN: 10 mg/dL (ref 6–20)
BUN: 11 mg/dL (ref 6–20)
CO2: 17 mmol/L — ABNORMAL LOW (ref 22–32)
CO2: 17 mmol/L — ABNORMAL LOW (ref 22–32)
Calcium: 7.2 mg/dL — ABNORMAL LOW (ref 8.9–10.3)
Calcium: 7.1 mg/dL — ABNORMAL LOW (ref 8.9–10.3)
Chloride: 103 mmol/L (ref 98–111)
Chloride: 100 mmol/L (ref 98–111)
Creatinine, Ser: 0.57 mg/dL (ref 0.44–1.00)
Creatinine, Ser: 0.64 mg/dL (ref 0.44–1.00)
GFR, Estimated: >60 mL/min (ref >60)
GFR, Estimated: >60 mL/min (ref >60)
Glucose, Bld: 146 mg/dL — ABNORMAL HIGH (ref 70–99)
Glucose, Bld: 126 mg/dL — ABNORMAL HIGH (ref 70–99)
Potassium: 3.7 mmol/L (ref 3.5–5.1)
Potassium: 4.2 mmol/L (ref 3.5–5.1)
Sodium: 130 mmol/L — ABNORMAL LOW (ref 135–145)
Sodium: 127 mmol/L — ABNORMAL LOW (ref 135–145)
Total Bilirubin: <0.1 mg/dL — ABNORMAL LOW (ref 0.3–1.2)
Total Bilirubin: 0.6 mg/dL (ref 0.3–1.2)
Total Protein: 6.7 g/dL (ref 6.5–8.1)
Total Protein: 6.5 g/dL (ref 6.5–8.1)

## 2022-03-02 LAB — CBC
HCT: 41.5 % (ref 36.0–46.0)
HCT: 42.6 % (ref 36.0–46.0)
Hemoglobin: 13.9 g/dL (ref 12.0–15.0)
Hemoglobin: 14.1 g/dL (ref 12.0–15.0)
MCH: 25.9 pg — ABNORMAL LOW (ref 26.0–34.0)
MCH: 26.4 pg (ref 26.0–34.0)
MCHC: 32.6 g/dL (ref 30.0–36.0)
MCHC: 34 g/dL (ref 30.0–36.0)
MCV: 77.6 fL — ABNORMAL LOW (ref 80.0–100.0)
MCV: 79.3 fL — ABNORMAL LOW (ref 80.0–100.0)
Platelets: 233 10*3/uL (ref 150–400)
Platelets: 261 10*3/uL (ref 150–400)
RBC: 5.35 MIL/uL — ABNORMAL HIGH (ref 3.87–5.11)
RBC: 5.37 MIL/uL — ABNORMAL HIGH (ref 3.87–5.11)
RDW: 15.9 % — ABNORMAL HIGH (ref 11.5–15.5)
RDW: 16.1 % — ABNORMAL HIGH (ref 11.5–15.5)
WBC: 10.8 10*3/uL — ABNORMAL HIGH (ref 4.0–10.5)
WBC: 14.2 10*3/uL — ABNORMAL HIGH (ref 4.0–10.5)
nRBC: 0 % (ref 0.0–0.2)
nRBC: 0 % (ref 0.0–0.2)

## 2022-03-02 LAB — MAGNESIUM: Magnesium: 6.5 mg/dL (ref 1.7–2.4)

## 2022-03-02 MED ORDER — ONDANSETRON HCL 4 MG PO TABS: 4.0000 mg | ORAL_TABLET | ORAL | Status: DC | PRN

## 2022-03-02 MED ORDER — COCONUT OIL OIL: 1.0000 | TOPICAL_OIL | Status: DC | PRN

## 2022-03-02 MED ORDER — TRANEXAMIC ACID-NACL 1000-0.7 MG/100ML-% IV SOLN
INTRAVENOUS | Status: AC
  Filled 2022-03-02: qty 100

## 2022-03-02 MED ORDER — MAGNESIUM SULFATE 40 GM/1000ML IV SOLN
1.0000 g/h | INTRAVENOUS | Status: AC
  Administered 2022-03-02: 1 g/h via INTRAVENOUS

## 2022-03-02 MED ORDER — LIDOCAINE HCL (PF) 1 % IJ SOLN
INTRAMUSCULAR | Status: DC | PRN
  Administered 2022-03-01: 11 mL via EPIDURAL

## 2022-03-02 MED ORDER — SIMETHICONE 80 MG PO CHEW: 80.0000 mg | CHEWABLE_TABLET | ORAL | Status: DC | PRN

## 2022-03-02 MED ORDER — IBUPROFEN 600 MG PO TABS
600.0000 mg | ORAL_TABLET | Freq: Four times a day (QID) | ORAL | Status: DC
  Administered 2022-03-02 – 2022-03-03 (×4): 600 mg via ORAL
  Filled 2022-03-02 (×6): qty 1

## 2022-03-02 MED ORDER — ZOLPIDEM TARTRATE 5 MG PO TABS: 5.0000 mg | ORAL_TABLET | Freq: Every evening | ORAL | Status: DC | PRN

## 2022-03-02 MED ORDER — DIPHENHYDRAMINE HCL 25 MG PO CAPS: 25.0000 mg | ORAL_CAPSULE | Freq: Four times a day (QID) | ORAL | Status: DC | PRN

## 2022-03-02 MED ORDER — BENZOCAINE-MENTHOL 20-0.5 % EX AERO: 1.0000 | INHALATION_SPRAY | CUTANEOUS | Status: DC | PRN

## 2022-03-02 MED ORDER — PRENATAL MULTIVITAMIN CH
1.0000 | ORAL_TABLET | Freq: Every day | ORAL | Status: DC
  Administered 2022-03-03: 1 via ORAL
  Filled 2022-03-02: qty 1

## 2022-03-02 MED ORDER — LACTATED RINGERS AMNIOINFUSION
INTRAVENOUS | Status: DC
  Filled 2022-03-02 (×3): qty 1000

## 2022-03-02 MED ORDER — ACETAMINOPHEN 325 MG PO TABS: 650.0000 mg | ORAL_TABLET | ORAL | Status: DC | PRN

## 2022-03-02 MED ORDER — WITCH HAZEL-GLYCERIN EX PADS: 1.0000 | MEDICATED_PAD | CUTANEOUS | Status: DC | PRN

## 2022-03-02 MED ORDER — MAGNESIUM SULFATE 40 GM/1000ML IV SOLN
2.0000 g/h | INTRAVENOUS | Status: DC
  Administered 2022-03-02: 2 g/h via INTRAVENOUS
  Filled 2022-03-02: qty 1000

## 2022-03-02 MED ORDER — TRANEXAMIC ACID-NACL 1000-0.7 MG/100ML-% IV SOLN
1000.0000 mg | INTRAVENOUS | Status: AC
  Administered 2022-03-02: 1000 mg via INTRAVENOUS

## 2022-03-02 MED ORDER — SENNOSIDES-DOCUSATE SODIUM 8.6-50 MG PO TABS
2.0000 | ORAL_TABLET | Freq: Every day | ORAL | Status: DC
  Administered 2022-03-03 – 2022-03-04 (×2): 2 via ORAL
  Filled 2022-03-02 (×2): qty 2

## 2022-03-02 MED ORDER — ONDANSETRON HCL 4 MG/2ML IJ SOLN: 4.0000 mg | INTRAMUSCULAR | Status: DC | PRN

## 2022-03-02 MED ORDER — DIBUCAINE (PERIANAL) 1 % EX OINT: 1.0000 | TOPICAL_OINTMENT | CUTANEOUS | Status: DC | PRN

## 2022-03-02 MED ORDER — TETANUS-DIPHTH-ACELL PERTUSSIS 5-2.5-18.5 LF-MCG/0.5 IM SUSY: 0.5000 mL | PREFILLED_SYRINGE | Freq: Once | INTRAMUSCULAR | Status: DC

## 2022-03-02 NOTE — Anesthesia Procedure Notes (Signed)
Epidural Patient location during procedure: OB Start time: 03/01/2022 11:48 PM End time: 03/02/2022 12:03 AM  Staffing Anesthesiologist: Lynda Rainwater, MD Performed: anesthesiologist   Preanesthetic Checklist Completed: patient identified, IV checked, site marked, risks and benefits discussed, surgical consent, monitors and equipment checked, pre-op evaluation and timeout performed  Epidural Patient position: sitting Prep: ChloraPrep Patient monitoring: heart rate, cardiac monitor, continuous pulse ox and blood pressure Approach: midline Location: L2-L3 Injection technique: LOR saline  Needle:  Needle type: Tuohy  Needle gauge: 17 G Needle length: 9 cm Needle insertion depth: 7 cm Catheter type: closed end flexible Catheter size: 20 Guage Catheter at skin depth: 12 cm Test dose: negative  Assessment Events: blood not aspirated, injection not painful, no injection resistance, no paresthesia and negative IV test  Additional Notes Reason for block:procedure for pain

## 2022-03-02 NOTE — Lactation Note (Signed)
This note was copied from a baby's chart. Lactation Consultation Note  Patient Name: Caroline Jones [id number] Date: 03/02/2022 Reason for consult: Initial assessment;Term Age:44 hours Baby very sleepy from mom being on Mag. Glucose level 37. [place] worked w/baby finally getting 20 ml formula in baby w/yellow nipple. Purple was to slow. Mom has DEBP set up at bedside. LC will go back for next feeding to hopefully get baby to latch to breast. Unable to see colostrum w/hand expression.  Maternal Data Has patient been taught Hand Expression?: Yes Does the patient have breastfeeding experience prior to this delivery?: Yes How long did the patient breastfeed?: her 42 yr old she expressed and bottle fed for 1 yr  Feeding Nipple Type: Slow - flow  LATCH Score Latch: Too sleepy or reluctant, no latch achieved, no sucking elicited.     Type of Nipple: Everted at rest and after stimulation            Lactation Tools Discussed/Used Tools: Pump Breast pump type: Double-Electric Breast Pump Reason for Pumping: supplement/stimulate  Interventions    Discharge    Consult Status Consult Status: Follow-up Date: 03/03/22 Follow-up type: In-patient    Theodoro Kalata 03/02/2022, 8:22 PM

## 2022-03-02 NOTE — Plan of Care (Signed)
  Problem: Education: Goal: Knowledge of General Education information will improve Description: Including pain rating scale, medication(s)/side effects and non-pharmacologic comfort measures Outcome: Progressing   Problem: Health Behavior/Discharge Planning: Goal: Ability to manage health-related needs will improve Outcome: Progressing   Problem: Clinical Measurements: Goal: Ability to maintain clinical measurements within normal limits will improve Outcome: Progressing Goal: Will remain free from infection Outcome: Progressing Goal: Diagnostic test results will improve Outcome: Progressing Goal: Respiratory complications will improve Outcome: Progressing Goal: Cardiovascular complication will be avoided Outcome: Progressing   Problem: Activity: Goal: Risk for activity intolerance will decrease Outcome: Progressing   Problem: Nutrition: Goal: Adequate nutrition will be maintained Outcome: Progressing   Problem: Coping: Goal: Level of anxiety will decrease Outcome: Progressing   Problem: Elimination: Goal: Will not experience complications related to bowel motility Outcome: Progressing Goal: Will not experience complications related to urinary retention Outcome: Progressing   Problem: Pain Managment: Goal: General experience of comfort will improve Outcome: Progressing   Problem: Safety: Goal: Ability to remain free from injury will improve Outcome: Progressing   Problem: Skin Integrity: Goal: Risk for impaired skin integrity will decrease Outcome: Progressing   Problem: Education: Goal: Knowledge of disease or condition will improve Outcome: Progressing Goal: Knowledge of the prescribed therapeutic regimen will improve Outcome: Progressing   Problem: Fluid Volume: Goal: Peripheral tissue perfusion will improve Outcome: Progressing   Problem: Clinical Measurements: Goal: Complications related to disease process, condition or treatment will be avoided or  minimized Outcome: Progressing   Problem: Education: Goal: Knowledge of condition will improve Outcome: Progressing Goal: Individualized Educational Video(s) Outcome: Progressing Goal: Individualized Newborn Educational Video(s) Outcome: Progressing   Problem: Activity: Goal: Will verbalize the importance of balancing activity with adequate rest periods Outcome: Progressing Goal: Ability to tolerate increased activity will improve Outcome: Progressing   Problem: Coping: Goal: Ability to identify and utilize available resources and services will improve Outcome: Progressing   Problem: Life Cycle: Goal: Chance of risk for complications during the postpartum period will decrease Outcome: Progressing   Problem: Role Relationship: Goal: Ability to demonstrate positive interaction with newborn will improve Outcome: Progressing   Problem: Skin Integrity: Goal: Demonstration of wound healing without infection will improve Outcome: Progressing   Problem: Education: Goal: Knowledge of disease or condition will improve Outcome: Progressing Goal: Knowledge of the prescribed therapeutic regimen will improve Outcome: Progressing   Problem: Fluid Volume: Goal: Peripheral tissue perfusion will improve Outcome: Progressing   Problem: Clinical Measurements: Goal: Complications related to disease process, condition or treatment will be avoided or minimized Outcome: Progressing   Problem: Education: Goal: Knowledge of condition will improve Outcome: Progressing Goal: Individualized Educational Video(s) Outcome: Progressing Goal: Individualized Newborn Educational Video(s) Outcome: Progressing   Problem: Activity: Goal: Will verbalize the importance of balancing activity with adequate rest periods Outcome: Progressing Goal: Ability to tolerate increased activity will improve Outcome: Progressing   Problem: Coping: Goal: Ability to identify and utilize available resources and  services will improve Outcome: Progressing   Problem: Life Cycle: Goal: Chance of risk for complications during the postpartum period will decrease Outcome: Progressing   Problem: Role Relationship: Goal: Ability to demonstrate positive interaction with newborn will improve Outcome: Progressing   Problem: Skin Integrity: Goal: Demonstration of wound healing without infection will improve Outcome: Progressing

## 2022-03-02 NOTE — Progress Notes (Signed)
Labor Progress Note  Caroline Jones, 42 y.o., [phone], with an IUP @ [redacted]w[redacted]d, presented for IOL for CHTN with SI PreE with SF with SR BP requiring IV labetalol protocol X2. Obesity BMI 46  Subjective: Pt sleeping well and comfortable with epidural, will defer vaginal check for now . Patient Active Problem List   Diagnosis Date Noted   Chronic hypertension affecting pregnancy 03/01/2022   Maternal obesity syndrome in third trimester 37/62/8315   Systolic murmur 17/61/6073   Chronic hypokalemia 07/28/2018   Raynauds phenomenon 09/10/2017   Vitamin D deficiency disease 09/10/2017   Other dietary vitamin B12 deficiency anemia 01/14/2016   Anemia, iron deficiency 01/10/2016   Essential hypertension, benign 10/21/2010   Pure hypercholesterolemia 10/21/2010   Objective: BP (!) 142/80   Pulse 74   Temp 98.2 F (36.8 C) (Oral)   Resp 16   Ht 5\' 4"  (1.626 m)   Wt 123.6 kg   LMP 06/02/2021   SpO2 94%   BMI 46.77 kg/m  I/O last 3 completed shifts: In: 2591.3 [P.O.:450; I.V.:2141.3] Out: 7106 [Urine:5475] Total I/O In: 150 [P.O.:150] Out: 125 [Urine:125] NST: FHR baseline 125 bpm, Variability: moderate. , Accelerations:present, Decelerations:  earlies noted occ= Cat 1 /Reactive CTX:  irregular, every 2-5 minutes Uterus gravid, soft non tender, moderate to palpate with contractions.  SVE:  Dilation: 6 Effacement (%): 80 Station: -2 Exam by:: Caroline Jones Pitocin start at 6  mUn/min  Assessment:  Caroline Jones, 42 y.o., [phone], with an IUP @ [redacted]w[redacted]d, presented for IOL for CHTN with SI PreE with SF with SR BP requiring IV labetalol protocol X2. Obesity BMI 64. Progressing in active labor.  Patient Active Problem List   Diagnosis Date Noted   Chronic hypertension affecting pregnancy 03/01/2022   Maternal obesity syndrome in third trimester 62/70/3500   Systolic murmur 93/81/8299   Chronic hypokalemia 07/28/2018   Raynauds phenomenon 09/10/2017   Vitamin D  deficiency disease 09/10/2017   Other dietary vitamin B12 deficiency anemia 01/14/2016   Anemia, iron deficiency 01/10/2016   Essential hypertension, benign 10/21/2010   Pure hypercholesterolemia 10/21/2010   NICHD: Category 1 with possible mag as culprit for minimum variability.   Membranes: AROM, thick mec 0209 on 2/4, no s/s of infection  IUPC: MVU 220s  Induction:    Cytotec x ( 50bu/25va @ 0159) (50BU/25VA @ 0627) (50BU @ 1140) (BU 44mcg @ 1602)  Foley Bulb: N/A cervix closed  Pitocin - 6  Pain management:               IV pain management: x fentanyl PRN  Nitrous: PRN             Epidural placement: Placed @ 2342 on 03/02/2022  GBS Negative  CHTN with SR BP: PCR 0.21, cmp unremarkable, plat 246, drawn at [place] on 2/3, asymptomatic, was on procardia 60mg  XL during pregnancy, held while on magnesium,  SR BP required IV labetalol protocol x2 and x1 IV hydralazine prior to epidural , continue mag 2gm/hr, mag level 5.4.    Plan: Continue labor plan Continuous monitoring Rest Frequent position changes to facilitate fetal rotation and descent. Will reassess with cervical exam at 0400 or earlier if necessary CHTN with SI PreE: switch to hydralazine protocol, given if SR BP noted, continue labetalol 200mg  PO BID, strick IO, neuro checks, redraw CBC w/I 4-6 hours for epidural  Start pitocin per protocol 1x1  Anticipate labor progression and vaginal delivery.  NICU for delivery r/t Mec.  [place], FNP-C, PMHNP-BC  [place] # [place], [place] [address]  Cell: [phone]  Office Phone: [phone] Fax: [phone] 03/02/2022  9:49 AM

## 2022-03-02 NOTE — Progress Notes (Signed)
Labor Progress Note  Caroline Jones, 42 y.o., [phone], with an IUP @ [redacted]w[redacted]d, presented for IOL for CHTN with SI PreE with SF with SR BP requiring IV labetalol protocol X2. Obesity BMI 46  Subjective: Pt in bed and able to rest, comfortable with epidural, discussed no cervical change and adding IUPC r/b/a reviewed, pt verbalized consent, RN not able to pick up on cxt pattern, need to start pitocin.  Patient Active Problem List   Diagnosis Date Noted   Chronic hypertension affecting pregnancy 03/01/2022   Maternal obesity syndrome in third trimester 63/01/6008   Systolic murmur 93/23/5573   Chronic hypokalemia 07/28/2018   Raynauds phenomenon 09/10/2017   Vitamin D deficiency disease 09/10/2017   Other dietary vitamin B12 deficiency anemia 01/14/2016   Anemia, iron deficiency 01/10/2016   Essential hypertension, benign 10/21/2010   Pure hypercholesterolemia 10/21/2010   Objective: BP (!) 140/76   Pulse 71   Temp (!) 97.5 F (36.4 C) (Oral)   Resp 16   Ht 5\' 4"  (1.626 m)   Wt 123.6 kg   LMP 06/02/2021   SpO2 94%   BMI 46.77 kg/m  I/O last 3 completed shifts: In: 1596.1 [P.O.:450; I.V.:1146.1] Out: 3300 [Urine:3300] Total I/O In: 930.9 [I.V.:930.9] Out: 1550 [Urine:1550] NST: FHR baseline 120 bpm, Variability: moderate/minimum at times. , Accelerations:present, Decelerations:  earlies noted occ= Cat 1-2 at times with minium variability /Reactive CTX:  irregular, every 2-5 minutes Uterus gravid, soft non tender, moderate to palpate with contractions.  SVE:  Dilation: 6 Effacement (%): 80 Station: -1 Exam by:: Kathie Dike, NP Pitocin start at 1  mUn/min IUPC placed with ease  Assessment:  Caroline Jones, 42 y.o., [phone], with an IUP @ [redacted]w[redacted]d, presented for IOL for CHTN with SI PreE with SF with SR BP requiring IV labetalol protocol X2. Obesity BMI 64. Progressing in active labor.  Patient Active Problem List   Diagnosis Date Noted   Chronic hypertension  affecting pregnancy 03/01/2022   Maternal obesity syndrome in third trimester 70/62/3762   Systolic murmur 83/15/1761   Chronic hypokalemia 07/28/2018   Raynauds phenomenon 09/10/2017   Vitamin D deficiency disease 09/10/2017   Other dietary vitamin B12 deficiency anemia 01/14/2016   Anemia, iron deficiency 01/10/2016   Essential hypertension, benign 10/21/2010   Pure hypercholesterolemia 10/21/2010   NICHD: Category 1/2 with possible mag as culprit for minimum variability.   Membranes: AROM, thick mec 0209 on 2/4, no s/s of infection  IUPC: MVU 160-170s  Induction:    Cytotec x ( 50bu/25va @ 0159) (50BU/25VA @ 0627) (50BU @ 1140) (BU 87mcg @ 1602)  Foley Bulb: N/A cervix closed  Pitocin - start at 1 now  Pain management:               IV pain management: x fentanyl PRN  Nitrous: PRN             Epidural placement: Placed @ 2342 on 03/02/2022  GBS Negative  CHTN with SR BP: PCR 0.21, cmp unremarkable, plat 246, drawn at [place] on 2/3, asymptomatic, was on procardia 60mg  XL during pregnancy, held while on magnesium,  SR BP required IV labetalol protocol x2 and x1 IV hydralazine prior to epidural , continue mag 2gm/hr, mag level 5.4.    Plan: Continue labor plan Continuous monitoring Rest Frequent position changes to facilitate fetal rotation and descent. Will reassess with cervical exam at 0400 or earlier if necessary CHTN with SI PreE: switch to hydralazine protocol, given if SR BP  noted, continue labetalol 200mg  PO BID, strick IO, neuro checks, redraw CBC w/I 4-6 hours for epidural  Start pitocin per protocol 1x1 to max of 10 r/t too many cxt for another cytotex/  Anticipate labor progression and vaginal delivery.  NICU for delivery r/t Mec.    [place], FNP-C, PMHNP-BC  [place] # [place] [employer], [place] [address]  Cell: [phone]  Office Phone: [phone] Fax: [phone] 03/02/2022  5:52 AM

## 2022-03-02 NOTE — Progress Notes (Signed)
Labor Progress Note  Caroline Jones, 42 y.o., [phone], with an IUP @ [redacted]w[redacted]d, presented for IOL for CHTN with SI PreE with SF with SR BP requiring IV labetalol protocol X2. Obesity BMI 46  Subjective: Pt getting comfortable with epidural, was nausea and making fast change, now in active labor, in bed with peanut, discussed AROM, R/B/A, pt and fetus tolerated well, thick particulate mec note, and large amount, NICU will be called for delivery, pt desires to rest, denies HA, RUQ pain or vision changes, still having mild nausea on zofran.  Patient Active Problem List   Diagnosis Date Noted   Chronic hypertension affecting pregnancy 03/01/2022   Maternal obesity syndrome in third trimester 08/67/6195   Systolic murmur 09/32/6712   Chronic hypokalemia 07/28/2018   Raynauds phenomenon 09/10/2017   Vitamin D deficiency disease 09/10/2017   Other dietary vitamin B12 deficiency anemia 01/14/2016   Anemia, iron deficiency 01/10/2016   Essential hypertension, benign 10/21/2010   Pure hypercholesterolemia 10/21/2010   Objective: BP (!) 145/74   Pulse 70   Temp (!) 97.5 F (36.4 C) (Oral)   Resp 16   Ht 5\' 4"  (1.626 m)   Wt 123.6 kg   LMP 06/02/2021   SpO2 94%   BMI 46.77 kg/m  I/O last 3 completed shifts: In: 1596.1 [P.O.:450; I.V.:1146.1] Out: 3300 [Urine:3300] Total I/O In: 930.9 [I.V.:930.9] Out: 4580 [Urine:1550] NST: FHR baseline 135 bpm, Variability: moderate, Accelerations:present, Decelerations:  Absent= Cat 1/Reactive CTX:  irregular, every 2-5 minutes Uterus gravid, soft non tender, moderate to palpate with contractions.  SVE:  Dilation: 6 Effacement (%): 80 Station: -1 Exam by:: Kathie Dike, NP Pitocin 0 mUn/min  Assessment:  Caroline Jones, 42 y.o., [phone], with an IUP @ [redacted]w[redacted]d, presented for IOL for CHTN with SI PreE with SF with SR BP requiring IV labetalol protocol X2. Obesity BMI 64. Progressing in active labor.  Patient Active Problem List   Diagnosis  Date Noted   Chronic hypertension affecting pregnancy 03/01/2022   Maternal obesity syndrome in third trimester 50/53/9767   Systolic murmur 34/19/3790   Chronic hypokalemia 07/28/2018   Raynauds phenomenon 09/10/2017   Vitamin D deficiency disease 09/10/2017   Other dietary vitamin B12 deficiency anemia 01/14/2016   Anemia, iron deficiency 01/10/2016   Essential hypertension, benign 10/21/2010   Pure hypercholesterolemia 10/21/2010   NICHD: Category 1  Membranes: AROM, thick mec 0209 on 2/4, no s/s of infection  Induction:    Cytotec x ( 50bu/25va @ 0159) (50BU/25VA @ 0627) (50BU @ 1140) (BU 20mcg @ 1602)  Foley Bulb: N/A cervix closed  Pitocin - 0. Never started pt making quick change.    Pain management:               IV pain management: x fentanyl PRN  Nitrous: PRN             Epidural placement: Placed @ 2342 on 03/02/2022  GBS Negative  CHTN with SR BP: PCR 0.21, cmp unremarkable, plat 246, drawn at [place] on 2/3, asymptomatic, was on procardia 60mg  XL during pregnancy, held while on magnesium,  SR BP required IV labetalol protocol x2 and x1 IV hydralazine prior to epidural , continue mag 2gm/hr, mag level 5.4.    Plan: Continue labor plan Continuous monitoring Rest Frequent position changes to facilitate fetal rotation and descent. Will reassess with cervical exam at 0400 or earlier if necessary CHTN with SI PreE: switch to hydralazine protocol, given if SR BP noted, continue labetalol 200mg   PO BID, strick IO, neuro checks, redraw CBC w/I 4-6 hours for epidural  Start pitocin per protocol 1x1 to max of 10 r/t too many cxt for another cytotex/  Anticipate labor progression and vaginal delivery.  NICU for delivery r/t Mec.    [place], FNP-C, PMHNP-BC  [place] # [place], [place] [address]  Cell: [phone]  Office Phone: [phone] Fax: [phone] 03/02/2022  2:26 AM

## 2022-03-02 NOTE — Lactation Note (Signed)
This note was copied from a baby's chart. Lactation Consultation Note  Patient Name: Caroline Jones [id number] Date: 03/02/2022 Reason for consult: Follow-up assessment;Term Age:44 hours Assisted baby to the breast. Baby suckled at intervals. Dad gave formula afterwards and LC hooked mom up to DEBP. Praised mom for all her hard work.  Maternal Data Has patient been taught Hand Expression?: Yes Does the patient have breastfeeding experience prior to this delivery?: Yes How long did the patient breastfeed?: her 42 yr old she expressed and bottle fed for 1 yr  Feeding Mother's Current Feeding Choice: Breast Milk and Formula Nipple Type: Slow - flow  LATCH Score Latch: Grasps breast easily, tongue down, lips flanged, rhythmical sucking.  Audible Swallowing: A few with stimulation  Type of Nipple: Everted at rest and after stimulation  Comfort (Breast/Nipple): Soft / non-tender  Hold (Positioning): Assistance needed to correctly position infant at breast and maintain latch.  LATCH Score: 8   Lactation Tools Discussed/Used Tools: Pump Breast pump type: Double-Electric Breast Pump Reason for Pumping: supplement/stimulate  Interventions Interventions: Breast feeding basics reviewed;Adjust position;DEBP;Assisted with latch;Support pillows;Skin to skin;Position options;Breast massage;Breast compression  Discharge    Consult Status Consult Status: Follow-up Date: 03/03/22 Follow-up type: In-patient    Theodoro Kalata 03/02/2022, 11:54 PM

## 2022-03-02 NOTE — Progress Notes (Signed)
Labor Progress Note  Caroline Jones, 42 y.o., [phone], with an IUP @ [redacted]w[redacted]d, presented for IOL for CHTN with SI PreE with SF with SR BP requiring IV labetalol protocol X2. Obesity BMI 46  Subjective: Pt sleeping and appears to be very sleepy, discussed fetal decel with pt and R/B/A of the decels continuing to happen versus PCS, pt denies any further questions at this time. Pitocin was stopped by RN and FSE was placed.  Patient Active Problem List   Diagnosis Date Noted   Chronic hypertension affecting pregnancy 03/01/2022   Maternal obesity syndrome in third trimester 13/08/6576   Systolic murmur 46/96/2952   Chronic hypokalemia 07/28/2018   Raynauds phenomenon 09/10/2017   Vitamin D deficiency disease 09/10/2017   Other dietary vitamin B12 deficiency anemia 01/14/2016   Anemia, iron deficiency 01/10/2016   Essential hypertension, benign 10/21/2010   Pure hypercholesterolemia 10/21/2010   Objective: BP 117/72   Pulse 66   Temp 98.2 F (36.8 C) (Oral)   Resp 16   Ht 5\' 4"  (1.626 m)   Wt 123.6 kg   LMP 06/02/2021   SpO2 93%   BMI 46.77 kg/m  I/O last 3 completed shifts: In: 2591.3 [P.O.:450; I.V.:2141.3] Out: 8413 [Urine:5475] Total I/O In: 150 [P.O.:150] Out: 125 [Urine:125] NST: FHR baseline 125 bpm, Variability: moderate. , Accelerations:present, Decelerations:  earlies noted occ= Cat 1 /Reactive CTX:  irregular, every 2-5 minutes Uterus gravid, soft non tender, moderate to palpate with contractions.  SVE:  Dilation: 8 Effacement (%): 80 Station: -1 Exam by:: [facility] CNM Pitocin start at 6  mUn/min  Assessment:  Caroline Jones, 42 y.o., [phone], with an IUP @ [redacted]w[redacted]d, presented for IOL for CHTN with SI PreE with SF with SR BP requiring IV labetalol protocol X2. Obesity BMI 64. Progressing in active labor.  Patient Active Problem List   Diagnosis Date Noted   Chronic hypertension affecting pregnancy 03/01/2022   Maternal obesity syndrome in third  trimester 72/53/6644   Systolic murmur 03/47/4259   Chronic hypokalemia 07/28/2018   Raynauds phenomenon 09/10/2017   Vitamin D deficiency disease 09/10/2017   Other dietary vitamin B12 deficiency anemia 01/14/2016   Anemia, iron deficiency 01/10/2016   Essential hypertension, benign 10/21/2010   Pure hypercholesterolemia 10/21/2010   NICHD: Category 2 with varibles and lated noted, fluid bolus of 250mg  given, amnio infusion started 300mg  bolus, 150mg  continuous, maternal psotion changes, RN placed FSE.IUPC feell out and was replaced.   Membranes: AROM, thick mec 0209 on 2/4, no s/s of infection  IUPC: MVU 220s  Induction:    Cytotec x ( 50bu/25va @ 0159) (50BU/25VA @ 0627) (50BU @ 1140) (BU 60mcg @ 1602)  Foley Bulb: N/A cervix closed  Pitocin - 6  Pain management:               IV pain management: x fentanyl PRN  Nitrous: PRN             Epidural placement: Placed @ 2342 on 03/02/2022  GBS Negative  CHTN with SR BP: PCR 0.21, cmp unremarkable, plat 246, drawn at [place] on 2/3, asymptomatic, was on procardia 60mg  XL during pregnancy, held while on magnesium,  SR BP required IV labetalol protocol x2 and x1 IV hydralazine prior to epidural , continue mag 2gm/hr, mag level 5.4.    Plan: Continue labor plan Continuous monitoring Rest Frequent position changes to facilitate fetal rotation and descent. Will reassess with cervical exam at 0400 or earlier if necessary CHTN with SI  PreE: switch to hydralazine protocol, given if SR BP noted, continue labetalol 200mg  PO BID, strick IO, neuro checks, redraw CBC w/I 4-6 hours for epidural  Stop pitocin and allow the baby to recover.  CBC, CMP, Mag levels drawn r/t pt sleepiness.  Start 365ml bolus of amnioinfusion.  Anticipate labor progression and vaginal delivery.  NICU for delivery r/t Mec.   DR Alesia Richards updated and notified about the strip.   [place], FNP-C, PMHNP-BC  [place] [address] [place], [place] [address]  Cell:  [phone]  Office Phone: [phone] Fax: [phone] 03/02/2022  11:48 AM

## 2022-03-03 ENCOUNTER — Inpatient Hospital Stay (HOSPITAL_COMMUNITY): Payer: No Typology Code available for payment source

## 2022-03-03 LAB — CBC
HCT: 32.9 % — ABNORMAL LOW (ref 36.0–46.0)
Hemoglobin: 10.9 g/dL — ABNORMAL LOW (ref 12.0–15.0)
MCH: 26.7 pg (ref 26.0–34.0)
MCHC: 33.1 g/dL (ref 30.0–36.0)
MCV: 80.4 fL (ref 80.0–100.0)
Platelets: 219 10*3/uL (ref 150–400)
RBC: 4.09 MIL/uL (ref 3.87–5.11)
RDW: 16.1 % — ABNORMAL HIGH (ref 11.5–15.5)
WBC: 12.3 10*3/uL — ABNORMAL HIGH (ref 4.0–10.5)
nRBC: 0 % (ref 0.0–0.2)

## 2022-03-03 LAB — COMPREHENSIVE METABOLIC PANEL
ALT: 28 U/L (ref 0–44)
AST: 22 U/L (ref 15–41)
Albumin: 1.8 g/dL — ABNORMAL LOW (ref 3.5–5.0)
Alkaline Phosphatase: 77 U/L (ref 38–126)
Anion gap: 7 (ref 5–15)
BUN: 15 mg/dL (ref 6–20)
CO2: 20 mmol/L — ABNORMAL LOW (ref 22–32)
Calcium: 6.8 mg/dL — ABNORMAL LOW (ref 8.9–10.3)
Chloride: 102 mmol/L (ref 98–111)
Creatinine, Ser: 0.85 mg/dL (ref 0.44–1.00)
GFR, Estimated: >60 mL/min (ref >60)
Glucose, Bld: 102 mg/dL — ABNORMAL HIGH (ref 70–99)
Potassium: 3.8 mmol/L (ref 3.5–5.1)
Sodium: 129 mmol/L — ABNORMAL LOW (ref 135–145)
Total Bilirubin: 0.3 mg/dL (ref 0.3–1.2)
Total Protein: 5 g/dL — ABNORMAL LOW (ref 6.5–8.1)

## 2022-03-03 LAB — MAGNESIUM: Magnesium: 5.4 mg/dL — ABNORMAL HIGH (ref 1.7–2.4)

## 2022-03-03 MED ORDER — NIFEDIPINE ER OSMOTIC RELEASE 60 MG PO TB24: 60.0000 mg | ORAL_TABLET | Freq: Every day | ORAL | Status: DC

## 2022-03-03 NOTE — Progress Notes (Signed)
PPD# {NUMBERS 1-5:20334} SVD w/  Information for the patient's newborn:  Felice, Hope [[id number]]  female   Baby Name *** Circumcision ***   S:   Reports feeling *** Tolerating PO fluid and solids No nausea or vomiting Bleeding is {Description; bleeding vaginal:11356} Pain controlled with {treatments; pain control med:13496} Up ad lib / ambulatory / voiding w/o difficulty Feeding: {Baby feeding:23562}    O:   VS: BP (!) 147/78 (BP Location: Left Arm)   Pulse 71   Temp 98.6 F (37 C) (Oral)   Resp 17   Ht 5\' 4"  (1.626 m)   Wt 123.6 kg   LMP 06/02/2021   SpO2 98%   Breastfeeding Unknown   BMI 46.77 kg/m   LABS:  Recent Labs    03/02/22 1117 03/03/22 0439  WBC 14.2* 12.3*  HGB 13.9 10.9*  PLT 261 219   Blood type: --/--/O POS (02/03 0045) Rubella: Immune (06/23 0000)                      I&O: Intake/Output      02/05 0701 02/06 0700   P.O. 1760   I.V. (mL/kg) 193.7 (1.6)   Other    Total Intake(mL/kg) 1953.7 (15.8)   Urine (mL/kg/hr) 1900 (1)   Blood    Total Output 1900   Net +53.7         Physical Exam: Alert and oriented X3 Lungs: Clear and unlabored Heart: regular rate and rhythm / no mumurs Abdomen: soft, non-tender, non-distended  Fundus: firm, non-tender, U*** Perineum: *** Lochia: *** Extremities: *** edema, no calf pain, tenderness, or cords    A:  PPD # ***  Normal exam  P:  Routine post partum orders *** Anticipate D/C on ***   Plan reviewed w/ Dr. Arrie Eastern, DNP, CNM 03/03/2022, 9:40 PM

## 2022-03-03 NOTE — Lactation Note (Signed)
This note was copied from a baby's chart. Lactation Consultation Note  Patient Name: Caroline Jones BTDVV'[id number] Date: 03/03/2022   Age:44 hours  LC in to see P2 mother of 39.0 GA infant with 3% weight loss. Mother is in the bathroom and LC offered to return. Mother has been pumping every 3 hours, has not collected milk yet. Father is formula feeding baby every 3 hours and states they like to keep baby on a schedule. He is tolerating bottle feeding well. Mother declines need for [facility] assist with breastfeeding due to past breastfeeding experience. Instructed that if they would like to have a [facility] visit or assist with latch, to call.   Feeding Nipple Type: Extra Slow Flow  Consult Status  PRN   Gwenevere Abbot 03/03/2022, 2:16 PM

## 2022-03-03 NOTE — Anesthesia Postprocedure Evaluation (Signed)
Anesthesia Post Note  Patient: Caroline Jones  Procedure(s) Performed: AN AD HOC LABOR EPIDURAL     Patient location during evaluation: OB High Risk Anesthesia Type: Epidural Level of consciousness: awake, oriented and awake and alert Pain management: pain level controlled Vital Signs Assessment: post-procedure vital signs reviewed and stable Respiratory status: spontaneous breathing, respiratory function stable and nonlabored ventilation Cardiovascular status: stable Postop Assessment: no headache, adequate PO intake, able to ambulate, patient able to bend at knees and no apparent nausea or vomiting Anesthetic complications: no   No notable events documented.  Last Vitals:  Vitals:   03/03/22 1100 03/03/22 1258  BP:  134/70  Pulse:  78  Resp: 16 18  Temp:  36.8 C  SpO2:  98%    Last Pain:  Vitals:   03/03/22 1258  TempSrc: Oral  PainSc:    Pain Goal: Patients Stated Pain Goal: 0 (03/02/22 0800)                 Zaydah Nawabi

## 2022-03-04 MED ORDER — LABETALOL HCL 5 MG/ML IV SOLN
INTRAVENOUS | Status: AC
  Filled 2022-03-04: qty 4

## 2022-03-04 MED ORDER — IBUPROFEN 600 MG PO TABS: 600.0000 mg | ORAL_TABLET | Freq: Four times a day (QID) | ORAL | 0 refills | Status: DC

## 2022-03-04 MED ORDER — NIFEDIPINE ER OSMOTIC RELEASE 60 MG PO TB24
60.0000 mg | ORAL_TABLET | Freq: Two times a day (BID) | ORAL | Status: DC
  Administered 2022-03-04: 60 mg via ORAL
  Filled 2022-03-04: qty 1

## 2022-03-04 MED ORDER — BENZOCAINE-MENTHOL 20-0.5 % EX AERO: 1.0000 | INHALATION_SPRAY | CUTANEOUS | 1 refills | Status: DC | PRN

## 2022-03-04 MED ORDER — LABETALOL HCL 5 MG/ML IV SOLN
40.0000 mg | INTRAVENOUS | Status: DC | PRN
  Filled 2022-03-04: qty 8

## 2022-03-04 MED ORDER — LABETALOL HCL 5 MG/ML IV SOLN: 80.0000 mg | INTRAVENOUS | Status: DC | PRN

## 2022-03-04 MED ORDER — ACCRUFER 30 MG PO CAPS: 1.0000 | ORAL_CAPSULE | Freq: Two times a day (BID) | ORAL | 1 refills | Status: DC

## 2022-03-04 MED ORDER — ACETAMINOPHEN 325 MG PO TABS: 650.0000 mg | ORAL_TABLET | ORAL | 0 refills | Status: DC | PRN

## 2022-03-04 MED ORDER — SENNOSIDES-DOCUSATE SODIUM 8.6-50 MG PO TABS: 2.0000 | ORAL_TABLET | Freq: Every day | ORAL | 0 refills | Status: DC

## 2022-03-04 MED ORDER — LABETALOL HCL 5 MG/ML IV SOLN
20.0000 mg | INTRAVENOUS | Status: DC | PRN
  Administered 2022-03-04 (×2): 20 mg via INTRAVENOUS
  Filled 2022-03-04 (×3): qty 4

## 2022-03-04 MED ORDER — HYDRALAZINE HCL 20 MG/ML IJ SOLN: 10.0000 mg | INTRAMUSCULAR | Status: DC | PRN

## 2022-03-04 NOTE — Progress Notes (Signed)
Patient screened out for psychosocial assessment since none of the following apply: °Psychosocial stressors documented in mother or baby's chart °Gestation less than 32 weeks °Code at delivery  °Infant with anomalies °Please contact the Clinical Social Worker if specific needs arise, by MOB's request, or if MOB scores greater than 9/yes to question 10 on Edinburgh Postpartum Depression Screen. ° °Mannat Benedetti Boyd-Gilyard, MSW, LCSW °Clinical Social Work °[phone] °  °

## 2022-03-04 NOTE — Progress Notes (Signed)
Discharge instructions and prescriptions given to pt. Discussed post vaginal delivery care, signs and symptoms to report to the MD, upcoming appointments, and meds. Pt verbalizes understanding and has no questions or concerns at this time. Pt left AMA in stable condition.

## 2022-03-04 NOTE — Discharge Summary (Signed)
Postpartum Discharge Summary  Date of Service updated2/6/24     Patient Name: Caroline Jones DOB: [birth year] MRN: [id number]  Date of admission: 03/01/2022 Delivery date:03/02/2022  Delivering provider: Noralyn Pick  Date of discharge: 03/04/2022  Admitting diagnosis: Chronic hypertension affecting pregnancy [O10.919] Intrauterine pregnancy: [redacted]w[redacted]d     Secondary diagnosis:  Principal Problem:   Chronic hypertension affecting pregnancy Active Problems:   Maternal obesity syndrome in third trimester   SVD (spontaneous vaginal delivery)  Additional problems: CHTN with superimposed preeclampsia    Discharge diagnosis: Term Pregnancy Delivered and CHTN with superimposed preeclampsia                                              Post partum procedures: none Augmentation: Pitocin and Cytotec Complications: None  Hospital course: Onset of Labor With Vaginal Delivery      42 y.o. yo [id number] at [redacted]w[redacted]d was admitted in Latent Labor on 03/01/2022. Labor course was complicated bysuperimposed preeclampsia .  Mother is leaving AMA  Membrane Rupture Time/Date: 2:09 AM ,03/02/2022   Delivery Method:Vaginal, Spontaneous  Episiotomy: None  Lacerations:  None  Patient had a postpartum course complicated by elevated blood pressures.  She is ambulating, tolerating a regular diet, passing flatus, and urinating well. Patient is discharged home in stable condition on 03/04/22.  Newborn Data: Birth date:03/02/2022  Birth time:1:46 PM  Gender:Female  Living status:Living  Apgars:8 ,9  Weight:2860 g   Magnesium Sulfate received: Yes: Seizure prophylaxis BMZ received: No Rhophylac:No MMR:No T-DaP:Given postpartum Flu: No Transfusion:No  Physical exam  Vitals:   03/04/22 0835 03/04/22 1209 03/04/22 1225 03/04/22 1241  BP: (!) 147/69 (!) 220/93 (!) 200/98 (!) 157/76  Pulse: 76 86  73  Resp:      Temp:      TempSrc:      SpO2:      Weight:      Height:       General: alert and cooperative CV  RRR Lungs CTAB  Lochia: appropriate Uterine Fundus: firm Incision: Dressing is clean, dry, and intact DVT Evaluation: No evidence of DVT seen on physical exam. Labs: Lab Results  Component Value Date   WBC 12.3 (H) 03/03/2022   HGB 10.9 (L) 03/03/2022   HCT 32.9 (L) 03/03/2022   MCV 80.4 03/03/2022   PLT 219 03/03/2022      Latest Ref Rng & Units 03/03/2022    4:39 AM  CMP  Glucose 70 - 99 mg/dL 102   BUN 6 - 20 mg/dL 15   Creatinine 0.44 - 1.00 mg/dL 0.85   Sodium 135 - 145 mmol/L 129   Potassium 3.5 - 5.1 mmol/L 3.8   Chloride 98 - 111 mmol/L 102   CO2 22 - 32 mmol/L 20   Calcium 8.9 - 10.3 mg/dL 6.8   Total Protein 6.5 - 8.1 g/dL 5.0   Total Bilirubin 0.3 - 1.2 mg/dL 0.3   Alkaline Phos 38 - 126 U/L 77   AST 15 - 41 U/L 22   ALT 0 - 44 U/L 28    Edinburgh Score:    03/02/2022   11:07 PM  Edinburgh Postnatal Depression Scale Screening Tool  I have been able to laugh and see the funny side of things. 0  I have looked forward with enjoyment to things. 0  I have blamed myself unnecessarily when things  went wrong. 1  I have been anxious or worried for no good reason. 0  I have felt scared or panicky for no good reason. 0  Things have been getting on top of me. 1  I have been so unhappy that I have had difficulty sleeping. 0  I have felt sad or miserable. 1  I have been so unhappy that I have been crying. 1  The thought of harming myself has occurred to me. 0  Edinburgh Postnatal Depression Scale Total 4      After visit meds:  Allergies as of 03/04/2022       Reactions   Amlodipine    edema        Medication List     TAKE these medications    ACCRUFeR 30 MG Caps Generic drug: Ferric Maltol Take 1 capsule (30 mg total) by mouth in the morning and at bedtime. What changed: how much to take   acetaminophen 325 MG tablet Commonly known as: Tylenol Take 2 tablets (650 mg total) by mouth every 4 (four) hours as needed (for pain scale < 4).    benzocaine-Menthol 20-0.5 % Aero Commonly known as: DERMOPLAST Apply 1 Application topically as needed for irritation (perineal discomfort).   ibuprofen 600 MG tablet Commonly known as: ADVIL Take 1 tablet (600 mg total) by mouth every 6 (six) hours.   multivitamin-prenatal 27-0.8 MG Tabs tablet Take 1 tablet by mouth daily.   NIFEdipine 60 MG 24 hr tablet Commonly known as: ADALAT CC Take 1 tablet (60 mg total) by mouth 2 (two) times daily.   senna-docusate 8.6-50 MG tablet Commonly known as: Senokot-S Take 2 tablets by mouth daily. Start taking on: March 05, 2022   Vitamin D3 50 MCG (2000 UT) Tabs Take 1 tablet by mouth daily.         Discharge home in stable condition Infant Feeding: Breast Infant Disposition:NICU Discharge instruction: per After Visit Summary and Postpartum booklet. Activity: Advance as tolerated. Pelvic rest for 6 weeks.  Diet: routine diet Anticipated Birth Control: Unsure Postpartum Appointment:1 week Additional Postpartum F/U: BP check 1 week Future Appointments:No future appointments. Follow up Visit:  [facility] Follow up.   Specialty: Obstetrics and Gynecology Why: Return to [facility] this week for a blood pressure check and again in 6 weeks for your postpartum visit. Contact information: [employer] [address]. [address] [place] [address] [facility]. Schedule an appointment as soon as possible for a visit in 1 week(s).   Specialty: Obstetrics and Gynecology Why: for blood pressure check Contact information: [address]. [address] [place] [address] [phone]               Pt is leaving against medical advice.  She was told risks of seizure or stroke and understands the risks.  She will follow up in the office in one week for blood pressure check . Pt given PIH  precautions     03/04/2022 Betsy Coder, MD

## 2022-03-04 NOTE — Progress Notes (Signed)
RN called to report severe range BP.  RN administered Labetalol per protocol.  Dr. Mancel Bale updated.  D/C'd oral Labetalol and restarted Procardia XL 60 mg PO daily Continue to monitor BP

## 2022-03-04 NOTE — Progress Notes (Signed)
Subjective:    Discussed changes in BP and need for IV antihypertensives. Pt desires to be discharged today and inquires about BP management by her PCP. Pt informed that HTN in the postpartum period is managed by the [facility] provider. Risks of discharge without a hypertension management plan discussed including seizure, stroke, future cardiovascular disease, or death. Denies visual changes, HA, and RUQ pain.  Objective:       03/04/2022    4:53 AM 03/04/2022    1:20 AM 03/04/2022   12:45 AM  Vitals with BMI  Systolic 035 009 381  Diastolic 67 58 78  Pulse 74 79 80      Latest Ref Rng & Units 03/03/2022    4:39 AM 03/02/2022   11:17 AM 03/02/2022   12:34 AM  CMP  Glucose 70 - 99 mg/dL 102  126  146   BUN 6 - 20 mg/dL 15  11  10    Creatinine 0.44 - 1.00 mg/dL 0.85  0.64  0.57   Sodium 135 - 145 mmol/L 129  127  130   Potassium 3.5 - 5.1 mmol/L 3.8  4.2  3.7   Chloride 98 - 111 mmol/L 102  100  103   CO2 22 - 32 mmol/L 20  17  17    Calcium 8.9 - 10.3 mg/dL 6.8  7.1  7.2   Total Protein 6.5 - 8.1 g/dL 5.0  6.5  6.7   Total Bilirubin 0.3 - 1.2 mg/dL 0.3  0.6  <0.1   Alkaline Phos 38 - 126 U/L 77  112  123   AST 15 - 41 U/L 22  34  32   ALT 0 - 44 U/L 28  35  30       Latest Ref Rng & Units 03/03/2022    4:39 AM 03/02/2022   11:17 AM 03/02/2022   12:34 AM  CBC  WBC 4.0 - 10.5 K/uL 12.3  14.2  10.8   Hemoglobin 12.0 - 15.0 g/dL 10.9  13.9  14.1   Hematocrit 36.0 - 46.0 % 32.9  42.6  41.5   Platelets 150 - 400 K/uL 219  261  233      Assessment/Plan:   42 y.o. G3P2103 [redacted]w[redacted]d PP day #2 CHTN w/ SI pre-e w/ SF S/P mag sulfate Labile BP with severe range values requiring IV antihypertensives DC'd oral Labetalol and returned to Procardia XL 60 mg daily On-coming team will determine if pt meets criteria for discharge Follow-up in office this week for BP check  Arrie Eastern DNP, CNM 03/04/2022 6:12 AM

## 2022-03-05 ENCOUNTER — Telehealth: Payer: Self-pay

## 2022-03-05 NOTE — Patient Outreach (Signed)
  Care Coordination [facility] Note Transition Care Management Unsuccessful Follow-up Telephone Call  Date of discharge and from where:  03/04/22 [facility] spontaneous vaginal delivery, chronic HTN  Attempts:  1st Attempt  Reason for unsuccessful TCM follow-up call:  Left voice message Peter Garter RN, [facility], [place] Management [phone]

## 2022-03-06 ENCOUNTER — Telehealth: Payer: Self-pay

## 2022-03-06 NOTE — Patient Outreach (Signed)
  Care Coordination [facility] Note Transition Care Management Unsuccessful Follow-up Telephone Call  Date of discharge and from where:  03/04/22 [facility]  dx spontaneous vaginal delivery, chronic hypertension  Attempts:  2nd Attempt  Reason for unsuccessful TCM follow-up call:  Left voice message Peter Garter RN, [facility], [place] Management [phone]

## 2022-03-07 ENCOUNTER — Telehealth: Payer: Self-pay

## 2022-03-07 NOTE — Patient Outreach (Signed)
  Care Coordination [facility] Note Transition Care Management Unsuccessful Follow-up Telephone Call  Date of discharge and from where:  03/04/22 [facility] spontaneous vaginal delivery, chronic hypertension   Attempts:  3rd Attempt  Reason for unsuccessful TCM follow-up call:  Left voice message Peter Garter RN, [facility], [place] Management [phone]

## 2022-03-12 ENCOUNTER — Telehealth (HOSPITAL_COMMUNITY): Payer: Self-pay | Admitting: *Deleted

## 2022-03-12 NOTE — Telephone Encounter (Signed)
Attempted Hospital Discharge Follow-Up Call.  Left voice mail requesting that patient return RN's phone call if patient has any concerns or questions.

## 2022-11-12 LAB — HM PAP SMEAR

## 2023-01-20 ENCOUNTER — Other Ambulatory Visit (HOSPITAL_COMMUNITY): Payer: Self-pay

## 2023-10-21 ENCOUNTER — Other Ambulatory Visit: Payer: Self-pay | Admitting: Internal Medicine

## 2023-10-21 DIAGNOSIS — Z1231 Encounter for screening mammogram for malignant neoplasm of breast: Secondary | ICD-10-CM

## 2023-11-09 ENCOUNTER — Ambulatory Visit
Admission: RE | Admit: 2023-11-09 | Discharge: 2023-11-09 | Disposition: A | Discharge status: Home / Self Care | Source: Ambulatory Visit

## 2023-11-09 DIAGNOSIS — Z1231 Encounter for screening mammogram for malignant neoplasm of breast: Secondary | ICD-10-CM

## 2023-12-10 ENCOUNTER — Ambulatory Visit: Payer: Self-pay | Admitting: Internal Medicine

## 2023-12-10 ENCOUNTER — Ambulatory Visit (INDEPENDENT_AMBULATORY_CARE_PROVIDER_SITE_OTHER): Admitting: Internal Medicine

## 2023-12-10 ENCOUNTER — Encounter: Payer: Self-pay | Admitting: Internal Medicine

## 2023-12-10 VITALS — BP 132/84 | HR 96 | Temp 99.1°F | Resp 16 | Ht 64.0 in | Wt 227.6 lb

## 2023-12-10 DIAGNOSIS — Z1211 Encounter for screening for malignant neoplasm of colon: Secondary | ICD-10-CM | POA: Insufficient documentation

## 2023-12-10 DIAGNOSIS — D513 Other dietary vitamin B12 deficiency anemia: Secondary | ICD-10-CM

## 2023-12-10 DIAGNOSIS — E7800 Pure hypercholesterolemia, unspecified: Secondary | ICD-10-CM

## 2023-12-10 DIAGNOSIS — I493 Ventricular premature depolarization: Secondary | ICD-10-CM

## 2023-12-10 DIAGNOSIS — Z0000 Encounter for general adult medical examination without abnormal findings: Secondary | ICD-10-CM | POA: Diagnosis not present

## 2023-12-10 DIAGNOSIS — E66812 Obesity, class 2: Secondary | ICD-10-CM

## 2023-12-10 DIAGNOSIS — I10 Essential (primary) hypertension: Secondary | ICD-10-CM

## 2023-12-10 DIAGNOSIS — Z0001 Encounter for general adult medical examination with abnormal findings: Secondary | ICD-10-CM | POA: Insufficient documentation

## 2023-12-10 DIAGNOSIS — E876 Hypokalemia: Secondary | ICD-10-CM

## 2023-12-10 DIAGNOSIS — D500 Iron deficiency anemia secondary to blood loss (chronic): Secondary | ICD-10-CM

## 2023-12-10 DIAGNOSIS — Z6839 Body mass index (BMI) 39.0-39.9, adult: Secondary | ICD-10-CM

## 2023-12-10 DIAGNOSIS — R011 Cardiac murmur, unspecified: Secondary | ICD-10-CM

## 2023-12-10 LAB — URINALYSIS, ROUTINE W REFLEX MICROSCOPIC
Bilirubin Urine: NEGATIVE
Hgb urine dipstick: NEGATIVE
Ketones, ur: NEGATIVE
Leukocytes,Ua: NEGATIVE
Nitrite: NEGATIVE
RBC / HPF: NONE SEEN (ref 0–?)
Specific Gravity, Urine: 1.015 (ref 1.000–1.030)
Total Protein, Urine: NEGATIVE
Urine Glucose: NEGATIVE
Urobilinogen, UA: 0.2 (ref 0.0–1.0)
pH: 5.5 (ref 5.0–8.0)

## 2023-12-10 LAB — HEPATIC FUNCTION PANEL
ALT: 11 U/L (ref 0–35)
AST: 13 U/L (ref 0–37)
Albumin: 3.8 g/dL (ref 3.5–5.2)
Alkaline Phosphatase: 87 U/L (ref 39–117)
Bilirubin, Direct: 0 mg/dL (ref 0.0–0.3)
Total Bilirubin: 0.3 mg/dL (ref 0.2–1.2)
Total Protein: 7.5 g/dL (ref 6.0–8.3)

## 2023-12-10 LAB — CBC WITH DIFFERENTIAL/PLATELET
Basophils Absolute: 0 K/uL (ref 0.0–0.1)
Basophils Relative: 0.8 % (ref 0.0–3.0)
Eosinophils Absolute: 0 K/uL (ref 0.0–0.7)
Eosinophils Relative: 0.6 % (ref 0.0–5.0)
HCT: 39.9 % (ref 36.0–46.0)
Hemoglobin: 13 g/dL (ref 12.0–15.0)
Lymphocytes Relative: 22.5 % (ref 12.0–46.0)
Lymphs Abs: 1.3 K/uL (ref 0.7–4.0)
MCHC: 32.7 g/dL (ref 30.0–36.0)
MCV: 76.1 fl — ABNORMAL LOW (ref 78.0–100.0)
Monocytes Absolute: 0.3 K/uL (ref 0.1–1.0)
Monocytes Relative: 5 % (ref 3.0–12.0)
Neutro Abs: 4.1 K/uL (ref 1.4–7.7)
Neutrophils Relative %: 71.1 % (ref 43.0–77.0)
Platelets: 322 K/uL (ref 150.0–400.0)
RBC: 5.24 Mil/uL — ABNORMAL HIGH (ref 3.87–5.11)
RDW: 16.2 % — ABNORMAL HIGH (ref 11.5–15.5)
WBC: 5.7 K/uL (ref 4.0–10.5)

## 2023-12-10 LAB — IBC + FERRITIN
Ferritin: 25.6 ng/mL (ref 10.0–291.0)
Iron: 69 ug/dL (ref 42–145)
Saturation Ratios: 23.8 % (ref 20.0–50.0)
TIBC: 289.8 ug/dL (ref 250.0–450.0)
Transferrin: 207 mg/dL — ABNORMAL LOW (ref 212.0–360.0)

## 2023-12-10 LAB — LIPID PANEL
Cholesterol: 191 mg/dL (ref 0–200)
HDL: 45.7 mg/dL (ref >39.00)
LDL Cholesterol: 131 mg/dL — ABNORMAL HIGH (ref 0–99)
NonHDL: 145.3
Total CHOL/HDL Ratio: 4
Triglycerides: 71 mg/dL (ref 0.0–149.0)
VLDL: 14.2 mg/dL (ref 0.0–40.0)

## 2023-12-10 LAB — FOLATE: Folate: 15.5 ng/mL (ref >5.9)

## 2023-12-10 LAB — TSH: TSH: 2.78 u[IU]/mL (ref 0.35–5.50)

## 2023-12-10 LAB — HEMOGLOBIN A1C: Hgb A1c MFr Bld: 5.5 % (ref 4.6–6.5)

## 2023-12-10 LAB — VITAMIN B12: Vitamin B-12: 338 pg/mL (ref 211–911)

## 2023-12-10 MED ORDER — NIFEDIPINE ER 90 MG PO TB24: 90.0000 mg | ORAL_TABLET | Freq: Every day | ORAL | 1 refills | Status: AC

## 2023-12-10 NOTE — Progress Notes (Signed)
 Subjective:  Patient ID: Caroline Jones, female    DOB: 21-Feb-1980  Age: 43 y.o. MRN: [id number]  CC: Annual Exam and Hypertension   HPI [name] presents for a CPX and f/up ---   Discussed the use of AI scribe software for clinical note transcription with the patient, who gave verbal consent to proceed.  History of Present Illness KENDAHL Jones is a 43 year old female with hypertension who presents for blood pressure management.  She experiences anxiety during blood pressure measurements, which she attributes to difficulty relaxing. She is concerned about having an elevated systolic number with a low diastolic number. No headaches, blurred vision, chest pain, or shortness of breath. During her pregnancy, her blood pressure was a concern, but she had no other complications and remained active, walking regularly.  She is currently taking nifedipine  60 mg once daily in the morning, as taking it twice a day caused her blood pressure to drop to 107/60-70 mmHg. She experiences some swelling and increased urination after taking nifedipine .  She has no history of diabetes and has not been screened for colon cancer. She denies any family history of colon cancer and has no symptoms such as blood in stool, weight loss, or abdominal pain.  She is active, working out most days, enjoys walking, and practices intermittent fasting. She has lost her pregnancy weight and aims to maintain her weight under 200 pounds. She is not on any form of contraception and her last menstrual cycle was on October 21st. Her son is nearly two years old.  Her last Pap smear was a year ago, and she usually has them every October. Her last mammogram was normal. She checks her blood pressure at home and notes that the readings are consistent with those taken in the office.     Outpatient Medications Prior to Visit  Medication Sig Dispense Refill   NIFEdipine  (ADALAT  CC) 60 MG 24 hr tablet Take 1 tablet (60  mg total) by mouth 2 (two) times daily. 60 tablet 0   acetaminophen  (TYLENOL ) 325 MG tablet Take 2 tablets (650 mg total) by mouth every 4 (four) hours as needed (for pain scale < 4). 30 tablet 0   benzocaine -Menthol  (DERMOPLAST) 20-0.5 % AERO Apply 1 Application topically as needed for irritation (perineal discomfort). 90 g 1   Cholecalciferol  (VITAMIN D3) 50 MCG (2000 UT) TABS Take 1 tablet by mouth daily. 90 tablet 1   Ferric Maltol  (ACCRUFER ) 30 MG CAPS Take 1 capsule (30 mg total) by mouth in the morning and at bedtime. 180 capsule 1   ibuprofen  (ADVIL ) 600 MG tablet Take 1 tablet (600 mg total) by mouth every 6 (six) hours. 30 tablet 0   Prenatal Vit-Fe Fumarate-FA (MULTIVITAMIN-PRENATAL) 27-0.8 MG TABS tablet Take 1 tablet by mouth daily.     senna-docusate (SENOKOT-S) 8.6-50 MG tablet Take 2 tablets by mouth daily. 60 tablet 0   No facility-administered medications prior to visit.    ROS Review of Systems  Constitutional:  Negative for appetite change, chills, diaphoresis, fatigue and fever.  HENT: Negative.  Negative for trouble swallowing.   Eyes: Negative.   Respiratory: Negative.  Negative for cough, chest tightness, shortness of breath and wheezing.   Cardiovascular:  Negative for chest pain, palpitations and leg swelling.  Gastrointestinal: Negative.  Negative for abdominal pain, blood in stool, constipation, diarrhea, nausea and vomiting.  Genitourinary: Negative.  Negative for difficulty urinating and dysuria.  Musculoskeletal: Negative.  Negative for arthralgias, joint swelling  and myalgias.  Skin: Negative.   Neurological:  Negative for dizziness, weakness, light-headedness and headaches.  Hematological:  Negative for adenopathy. Does not bruise/bleed easily.  Psychiatric/Behavioral: Negative.      Objective:  BP 132/84 (BP Location: Left Arm, Patient Position: Sitting, Cuff Size: Normal)   Pulse 96   Temp 99.1 F (37.3 C) (Oral)   Resp 16   Ht 5' 4 (1.626 m)    Wt 227 lb 9.6 oz (103.2 kg)   LMP 11/17/2023   SpO2 99%   Breastfeeding No   BMI 39.07 kg/m   BP Readings from Last 3 Encounters:  12/10/23 132/84  03/04/22 (!) 157/76  08/23/21 (!) 159/94    Wt Readings from Last 3 Encounters:  12/10/23 227 lb 9.6 oz (103.2 kg)  03/01/22 272 lb 8 oz (123.6 kg)  08/22/21 245 lb 11.2 oz (111.4 kg)    Physical Exam Vitals reviewed.  Constitutional:      Appearance: Normal appearance.  HENT:     Nose: Nose normal.     Mouth/Throat:     Mouth: Mucous membranes are moist.  Eyes:     General: No scleral icterus.    Conjunctiva/sclera: Conjunctivae normal.  Cardiovascular:     Rate and Rhythm: Normal rate and regular rhythm. Occasional Extrasystoles are present.    Heart sounds: Murmur heard.     Systolic murmur is present with a grade of 2/6.     No friction rub. No gallop.     Comments: EKG--  SR with PVC's (new), 78 bpm No LVH, Q waves, or ST/T wave changes  Pulmonary:     Effort: Pulmonary effort is normal. No respiratory distress.     Breath sounds: No stridor. No wheezing, rhonchi or rales.  Chest:     Chest wall: No tenderness.  Abdominal:     General: Abdomen is protuberant. Bowel sounds are normal. There is no distension.     Palpations: There is no hepatomegaly, splenomegaly or mass.     Tenderness: There is no abdominal tenderness. There is no guarding.     Hernia: No hernia is present.  Musculoskeletal:        General: No swelling. Normal range of motion.     Cervical back: Neck supple.     Right lower leg: No edema.     Left lower leg: No edema.  Lymphadenopathy:     Cervical: No cervical adenopathy.  Skin:    General: Skin is warm and dry.  Neurological:     General: No focal deficit present.     Mental Status: She is alert. Mental status is at baseline.  Psychiatric:        Mood and Affect: Mood normal.        Behavior: Behavior normal.     Lab Results  Component Value Date   WBC 5.7 12/10/2023   HGB 13.0  12/10/2023   HCT 39.9 12/10/2023   PLT 322.0 12/10/2023   GLUCOSE 71 12/11/2023   CHOL 191 12/10/2023   TRIG 71.0 12/10/2023   HDL 45.70 12/10/2023   LDLCALC 131 (H) 12/10/2023   ALT 11 12/10/2023   AST 13 12/10/2023   NA 139 12/11/2023   K 3.2 (L) 12/11/2023   CL 103 12/11/2023   CREATININE 0.60 12/11/2023   BUN 13 12/11/2023   CO2 22 12/11/2023   TSH 2.78 12/10/2023   HGBA1C 5.5 12/10/2023    MM 3D SCREENING MAMMOGRAM BILATERAL BREAST Result Date: 11/10/2023 CLINICAL DATA:  Screening.  EXAM: DIGITAL SCREENING BILATERAL MAMMOGRAM WITH TOMOSYNTHESIS AND CAD TECHNIQUE: Bilateral screening digital craniocaudal and mediolateral oblique mammograms were obtained. Bilateral screening digital breast tomosynthesis was performed. The images were evaluated with computer-aided detection. COMPARISON:  Previous exam(s). ACR Breast Density Category b: There are scattered areas of fibroglandular density. FINDINGS: There are no findings suspicious for malignancy. IMPRESSION: No mammographic evidence of malignancy. A result letter of this screening mammogram will be mailed directly to the patient. RECOMMENDATION: Screening mammogram in one year. (Code:SM-B-01Y) BI-RADS CATEGORY  1: Negative. Electronically Signed   By: Curtistine Noble   On: 11/10/2023 15:33    Assessment & Plan:  Screening for colon cancer -     Cologuard  Class 2 severe obesity due to excess calories with serious comorbidity and body mass index (BMI) of 39.0 to 39.9 in adult -     TSH; Future -     Hepatic function panel; Future -     Hemoglobin A1c; Future  Other dietary vitamin B12 deficiency anemia -     Vitamin B12; Future -     CBC with Differential/Platelet; Future -     Folate; Future  Iron deficiency anemia due to chronic blood loss -     IBC + Ferritin; Future -     CBC with Differential/Platelet; Future  Essential hypertension, benign- Her BP is well controled. -     IBC + Ferritin; Future -     CBC with  Differential/Platelet; Future -     TSH; Future -     Urinalysis, Routine w reflex microscopic; Future -     Hepatic function panel; Future -     EKG 12-Lead -     NIFEdipine  ER; Take 1 tablet (90 mg total) by mouth daily.  Dispense: 90 tablet; Refill: 1  Encounter for general adult medical examination with abnormal findings- Exam completed, labs reviewed, vaccines reviewed and updated, cancer screenings addressed, pt ed material was given.  -     Lipid panel; Future  Murmur -     ECHOCARDIOGRAM COMPLETE; Future  Frequent PVCs- Will treat the hypokalemia -     ECHOCARDIOGRAM COMPLETE; Future  Pure hypercholesterolemia- Statin is not indicated.     Follow-up: Return in about 6 months (around 06/08/2024).  Debby Molt, MD

## 2023-12-10 NOTE — Patient Instructions (Signed)

## 2023-12-11 ENCOUNTER — Telehealth: Payer: Self-pay

## 2023-12-11 ENCOUNTER — Ambulatory Visit (INDEPENDENT_AMBULATORY_CARE_PROVIDER_SITE_OTHER)

## 2023-12-11 DIAGNOSIS — I10 Essential (primary) hypertension: Secondary | ICD-10-CM

## 2023-12-11 LAB — BASIC METABOLIC PANEL WITH GFR
BUN: 13 mg/dL (ref 6–23)
CO2: 22 meq/L (ref 19–32)
Calcium: 8.7 mg/dL (ref 8.4–10.5)
Chloride: 103 meq/L (ref 96–112)
Creatinine, Ser: 0.6 mg/dL (ref 0.40–1.20)
GFR: 108.41 mL/min (ref >60.00)
Glucose, Bld: 71 mg/dL (ref 70–99)
Potassium: 3.2 meq/L — ABNORMAL LOW (ref 3.5–5.1)
Sodium: 139 meq/L (ref 135–145)

## 2023-12-11 NOTE — Telephone Encounter (Signed)
 Copied from CRM #[id number]. Topic: Clinical - Prescription Issue >> Dec 11, 2023 11:29 AM Dedra B wrote: Reason for CRM: Pt husband, Cheryal, said pt was previously taken NIFEdipine  60 mg daily, but NIFEdipine  (ADALAT  CC) 90 MG 24 hr tablet was sent to pharmacy. Pt wants to make sure this is accurate before picking it up. Pls call pt or husband Cheryal.

## 2023-12-12 ENCOUNTER — Other Ambulatory Visit: Payer: Self-pay | Admitting: Internal Medicine

## 2023-12-12 DIAGNOSIS — E876 Hypokalemia: Secondary | ICD-10-CM | POA: Insufficient documentation

## 2023-12-12 DIAGNOSIS — I10 Essential (primary) hypertension: Secondary | ICD-10-CM

## 2023-12-12 MED ORDER — POTASSIUM CHLORIDE CRYS ER 20 MEQ PO TBCR: 20.0000 meq | EXTENDED_RELEASE_TABLET | Freq: Two times a day (BID) | ORAL | 1 refills | Status: DC

## 2023-12-12 MED ORDER — POTASSIUM CHLORIDE CRYS ER 20 MEQ PO TBCR: 20.0000 meq | EXTENDED_RELEASE_TABLET | Freq: Two times a day (BID) | ORAL | 1 refills | Status: AC

## 2023-12-23 ENCOUNTER — Encounter (HOSPITAL_COMMUNITY): Payer: Self-pay | Admitting: Internal Medicine

## 2023-12-29 LAB — COLOGUARD: COLOGUARD: NEGATIVE
